# Patient Record
Sex: Male | Born: 1965
Health system: Southern US, Community
[De-identification: ages and names within clinical notes are randomized; demographics above are authoritative.]

## PROBLEM LIST (undated history)

## (undated) DIAGNOSIS — E7800 Pure hypercholesterolemia, unspecified: Secondary | ICD-10-CM

## (undated) DIAGNOSIS — E079 Disorder of thyroid, unspecified: Secondary | ICD-10-CM

## (undated) HISTORY — PX: SHOULDER SURGERY: SHX246

---

## 2006-02-02 ENCOUNTER — Ambulatory Visit (HOSPITAL_COMMUNITY): Admission: RE | Admit: 2006-02-02 | Discharge: 2006-02-02 | Payer: Self-pay | Admitting: *Deleted

## 2008-01-27 ENCOUNTER — Encounter: Admission: RE | Admit: 2008-01-27 | Discharge: 2008-01-27 | Payer: Self-pay | Admitting: Family Medicine

## 2008-02-03 ENCOUNTER — Encounter: Admission: RE | Admit: 2008-02-03 | Discharge: 2008-02-03 | Payer: Self-pay | Admitting: Family Medicine

## 2010-06-05 ENCOUNTER — Encounter: Payer: Self-pay | Admitting: *Deleted

## 2011-04-16 ENCOUNTER — Emergency Department (HOSPITAL_COMMUNITY): Payer: BC Managed Care – PPO

## 2011-04-16 ENCOUNTER — Encounter: Payer: Self-pay | Admitting: *Deleted

## 2011-04-16 ENCOUNTER — Emergency Department (HOSPITAL_COMMUNITY)
Admission: EM | Admit: 2011-04-16 | Discharge: 2011-04-16 | Disposition: A | Discharge status: Home / Self Care | Payer: BC Managed Care – PPO | Attending: Emergency Medicine | Admitting: Emergency Medicine

## 2011-04-16 DIAGNOSIS — S92009A Unspecified fracture of unspecified calcaneus, initial encounter for closed fracture: Secondary | ICD-10-CM

## 2011-04-16 DIAGNOSIS — E7800 Pure hypercholesterolemia, unspecified: Secondary | ICD-10-CM | POA: Insufficient documentation

## 2011-04-16 DIAGNOSIS — Z79899 Other long term (current) drug therapy: Secondary | ICD-10-CM | POA: Insufficient documentation

## 2011-04-16 DIAGNOSIS — M79609 Pain in unspecified limb: Secondary | ICD-10-CM | POA: Insufficient documentation

## 2011-04-16 DIAGNOSIS — W11XXXA Fall on and from ladder, initial encounter: Secondary | ICD-10-CM | POA: Insufficient documentation

## 2011-04-16 DIAGNOSIS — Z7982 Long term (current) use of aspirin: Secondary | ICD-10-CM | POA: Insufficient documentation

## 2011-04-16 DIAGNOSIS — M25579 Pain in unspecified ankle and joints of unspecified foot: Secondary | ICD-10-CM | POA: Insufficient documentation

## 2011-04-16 HISTORY — DX: Disorder of thyroid, unspecified: E07.9

## 2011-04-16 HISTORY — DX: Pure hypercholesterolemia, unspecified: E78.00

## 2011-04-16 MED ORDER — HYDROCODONE-ACETAMINOPHEN 5-500 MG PO TABS: 1.0000 | ORAL_TABLET | Freq: Four times a day (QID) | ORAL | Status: AC | PRN

## 2011-04-16 MED ORDER — IBUPROFEN 800 MG PO TABS
800.0000 mg | ORAL_TABLET | Freq: Once | ORAL | Status: AC
  Administered 2011-04-16: 800 mg via ORAL
  Filled 2011-04-16: qty 1

## 2011-04-16 NOTE — Progress Notes (Signed)
Orthopedic Tech Progress Note Patient Details:  Robert Montes 01-30-1966 [id number]  Type of Splint: Short Leg;Stirrup Splint Location: applied to left leg    Gaye Pollack 04/16/2011, 3:17 PM

## 2011-04-16 NOTE — ED Notes (Signed)
Waiting for CT scan at this time.

## 2011-04-16 NOTE — ED Provider Notes (Signed)
History     CSN: [id number] Arrival date & time: 04/16/2011  9:16 AM   First MD Initiated Contact with Patient 04/16/11 [phone]      Chief Complaint  Patient presents with  . Foot Pain    (Consider location/radiation/quality/duration/timing/severity/associated sxs/prior treatment) HPI  Patient presents to emergency department complaining of acute onset bilateral heel and foot pain stating yesterday when he was on top of a ladder, approximately 15 feet and began to lose his balance and therefore jumped off to the ground landing directly on bilateral heels. Patient states that he had immediate pain however throughout the day yesterday and today ongoing persistent pain greatest in left he'll with inability to bear weight on left he'll do to pain. Patient states mild pain and tenderness into right foot and he'll but able to bear weight without difficulty. Patient has been taking at home, ibuprofen for pain but states ongoing inability to bear weight due to severe pain in left he'll. Patient denies any lower back pain, hip pain, or knee pain. Patient states he has hypothyroid and high cholesterol but no other known medical problems. Patient states when he ambulates he has no pain in back, hips, or knees however does have pain and left he'll. Patient has a primary care physician, but no orthopedic specialist. Past Medical History  Diagnosis Date  . High cholesterol   . Thyroid disease     History reviewed. No pertinent past surgical history.  History reviewed. No pertinent family history.  History  Substance Use Topics  . Smoking status: Never Smoker   . Smokeless tobacco: Not on file  . Alcohol Use:       Review of Systems  All other systems reviewed and are negative.    Allergies  Review of patient's allergies indicates no known allergies.  Home Medications   Current Outpatient Rx  Name Route Sig Dispense Refill  . ASPIRIN EC 81 MG PO TBEC Oral Take 81 mg by mouth daily.        . ATORVASTATIN CALCIUM 10 MG PO TABS Oral Take 10 mg by mouth daily.      . IBUPROFEN 200 MG PO TABS Oral Take 400 mg by mouth every 6 (six) hours as needed. For pain.     Marland Kitchen LEVOTHYROXINE SODIUM 112 MCG PO TABS Oral Take 112 mcg by mouth daily.      . NYQUIL PO Oral Take 2 capsules by mouth every 8 (eight) hours as needed. For cold.       BP 140/82  Pulse 76  Temp(Src) 97.9 F (36.6 C) (Oral)  SpO2 97%  Physical Exam  Nursing note and vitals reviewed. Constitutional: He is oriented to person, place, and time. He appears well-developed and well-nourished. No distress.  HENT:  Head: Normocephalic and atraumatic.  Eyes: Conjunctivae are normal.  Neck: Normal range of motion. Neck supple.  Cardiovascular: Normal rate.   Pulmonary/Chest: Effort normal.  Abdominal: Soft. Bowel sounds are normal. He exhibits no distension and no mass. There is no tenderness. There is no rebound and no guarding.  Musculoskeletal: Normal range of motion. He exhibits tenderness. He exhibits no edema.       FROM of neck without pain and no spinal TTP or soft tissue TTP of back.  FROM of bilateral hips and knees without pain and no TTP.  No TTP of bilateral ankles. TTP of bilateral heels but greater TTP of left heel and forefoot. No break in skin, deformity or swelling. Good pedal pulse bilaterally.  Neurological: He is alert and oriented to person, place, and time. He has normal reflexes.  Skin: Skin is warm and dry. No rash noted. He is not diaphoretic. No erythema.  Psychiatric: He has a normal mood and affect.    ED Course  Procedures (including critical care time)  PO ibuprofen  Reviewed CT scan with Dr. Shon Baton with him stating that the injury should be nonweightbearing and that a posterior splint with stirrups should be applied. Patient is appropriate for outpatient followup with Dr. Victorino Dike per Dr. Shon Baton.  Labs Reviewed - No data to display No results found.   1. Fracture of calcaneus        MDM  No complaints of lower back, hip or knee pain after fall. Only complaining of heel pain with calcaneous fx on CT scan. At length about treatment plan and followup with orthopedic specialist. He voices his understanding.   Medical screening examination/treatment/procedure(s) were performed by non-physician practitioner and as supervising physician I was immediately available for consultation/collaboration. Osvaldo Human, M.D.      Jenness Corner, PA 04/16/11 [address] [place], [place] 04/16/11 1527  Carleene Cooper III, MD 04/17/11 [phone]

## 2011-04-16 NOTE — ED Notes (Signed)
Patient jumped off of a ladder yesterday landing on his heels.  Pt states that he has had increase in pain to left heel and arch of his left foot.  Pt states that he is unable to bear weight without pain.  No trauma noted.  Pt states that he has been icing and elevating bilateral feet.  Pt took Advil yesterday for pain, none today.

## 2011-04-16 NOTE — ED Notes (Signed)
Patient transported to CT 

## 2011-04-16 NOTE — ED Notes (Signed)
Pt waiting for radiology at this time.

## 2013-01-28 IMAGING — CR DG FOOT COMPLETE 3+V*L*
3 series · 3 of 3 positions shown · non-contrast
Comparison: Os calcis today.

CLINICAL DATA: Fall, foot pain.

LEFT FOOT - COMPLETE 3+ VIEW

[x foot ap left]
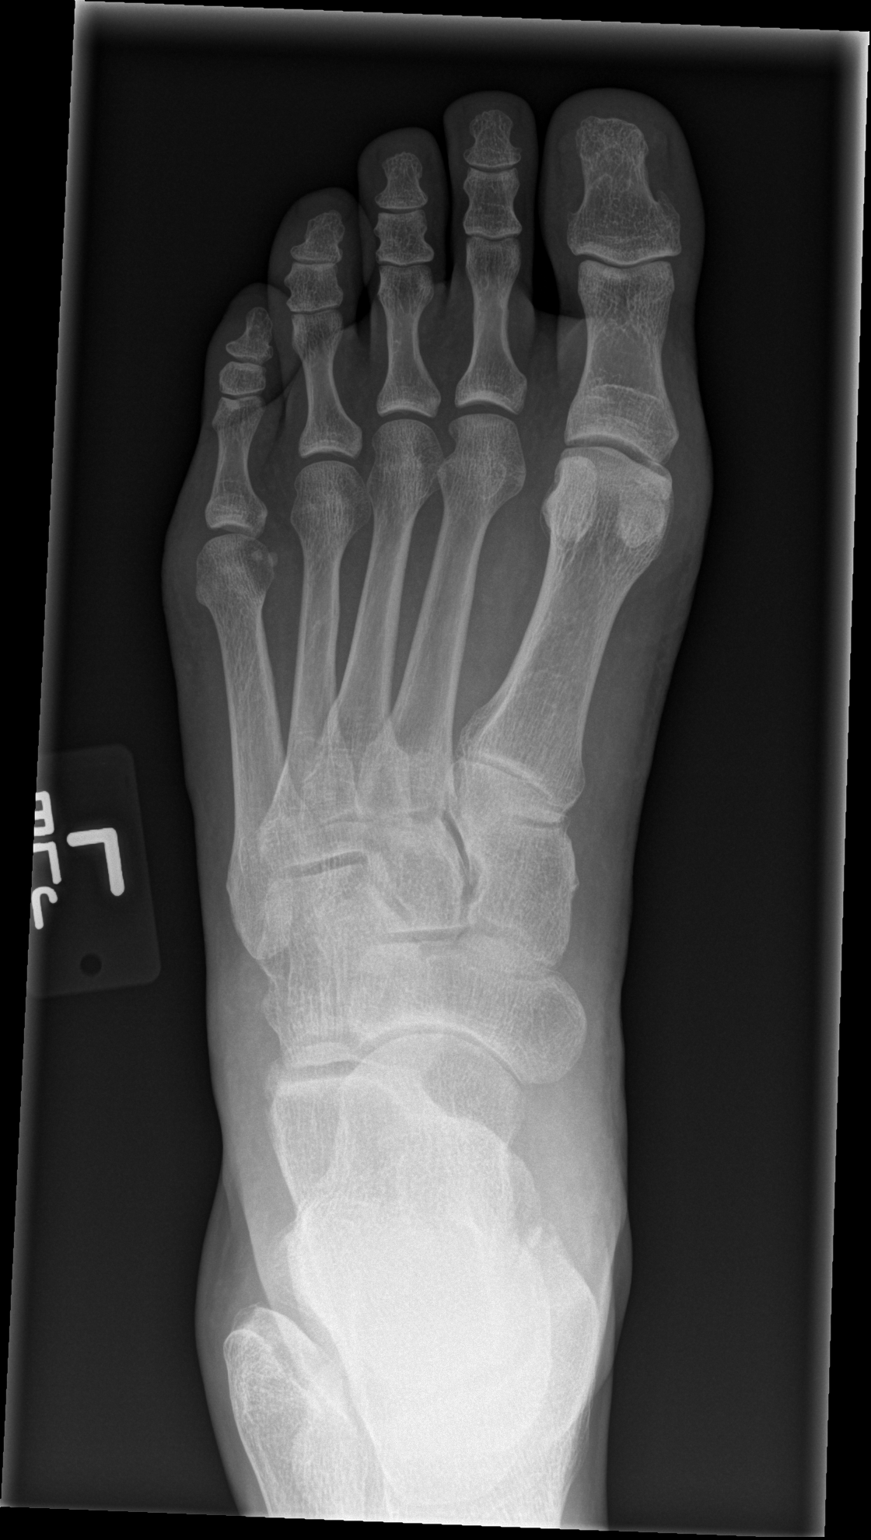

[x foot obl left]
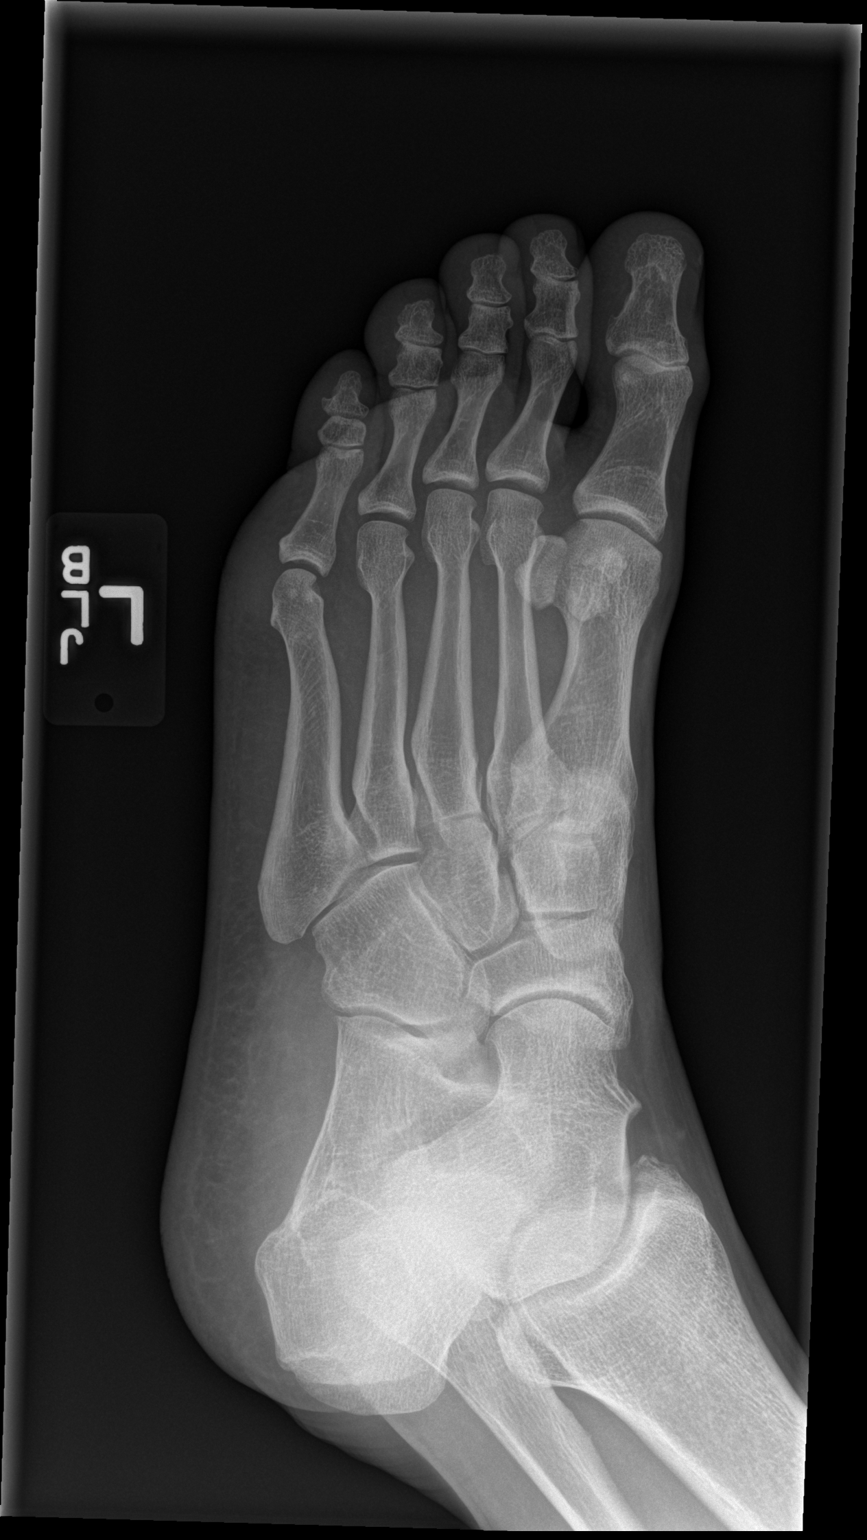

[x foot lat left]
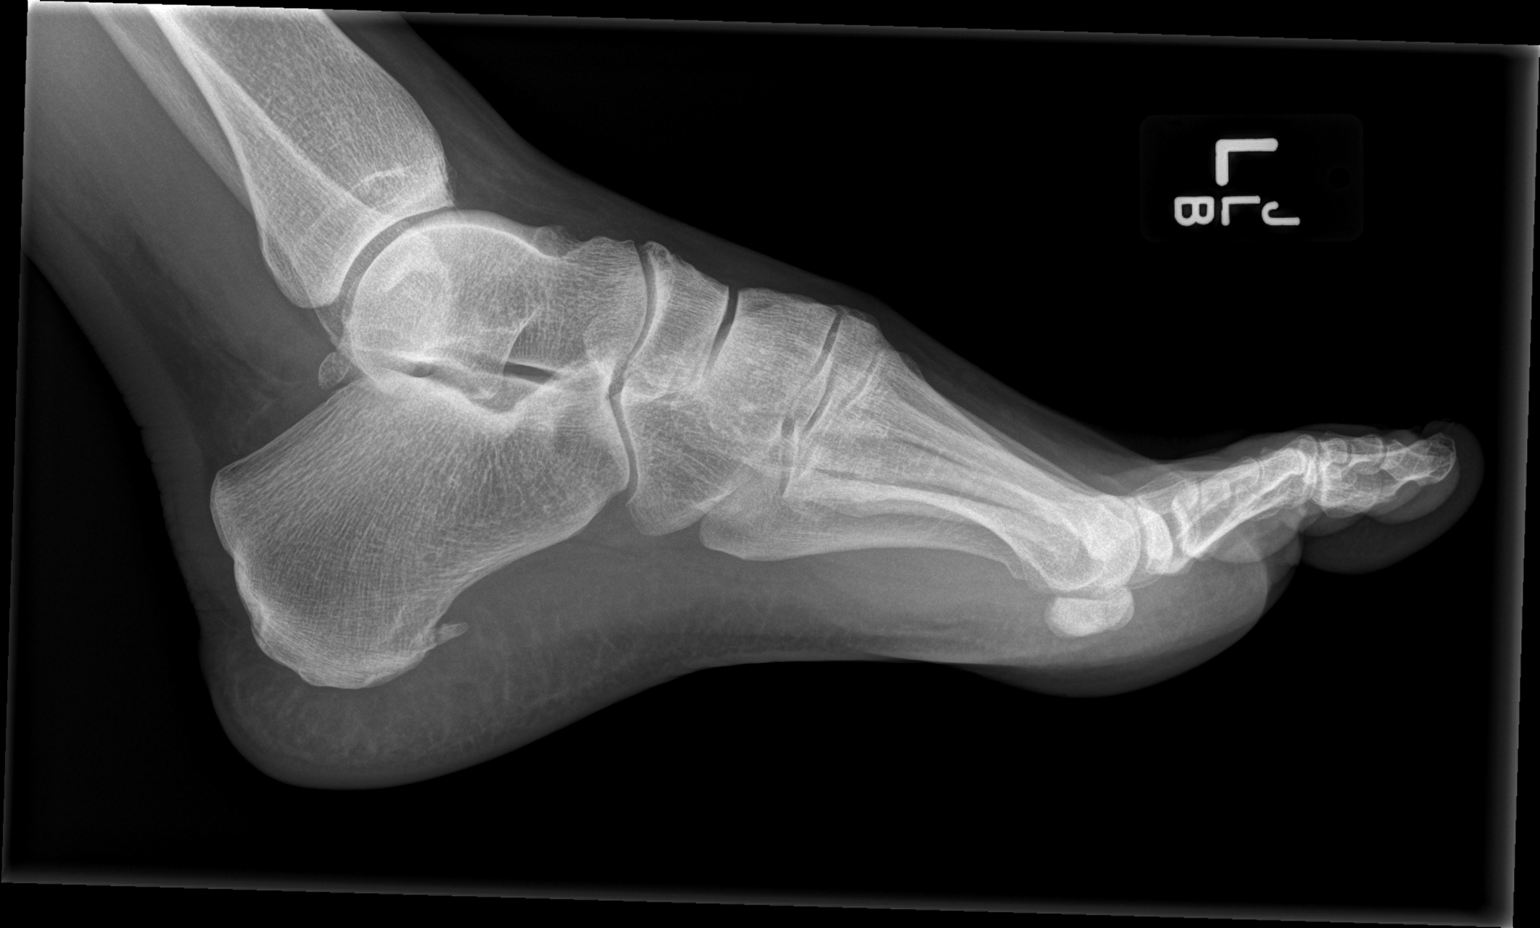

[3 of 3 positions shown; findings below may reference images not displayed]

FINDINGS: Plantar calcaneal spurs noted.  Question fracture through
the base of the spur.  No additional acute bony abnormality.  Joint
spaces are maintained.  Soft tissues are unremarkable.
IMPRESSION: Calcaneal spur.  Question fracture through the base of the spur.
No additional acute bony abnormality.

## 2013-01-28 IMAGING — CT CT FOOT*L* W/O CM
2 of 4 series · 7 of 35 positions shown, 8 images · non-contrast
Comparison: Radiographs same date.

CLINICAL DATA: Fell.  Injured foot.

CT OF THE LEFT FOOT WITHOUT CONTRAST
TECHNIQUE: Multidetector CT imaging was performed according to the
standard protocol. Multiplanar CT image reconstructions were also
generated.

[coronal left heel · axial · 0.25mm/px · z∈[-207,-150]mm · 4 of 71 slices shown, 5 images]
[im 12/71  soft-tissue]
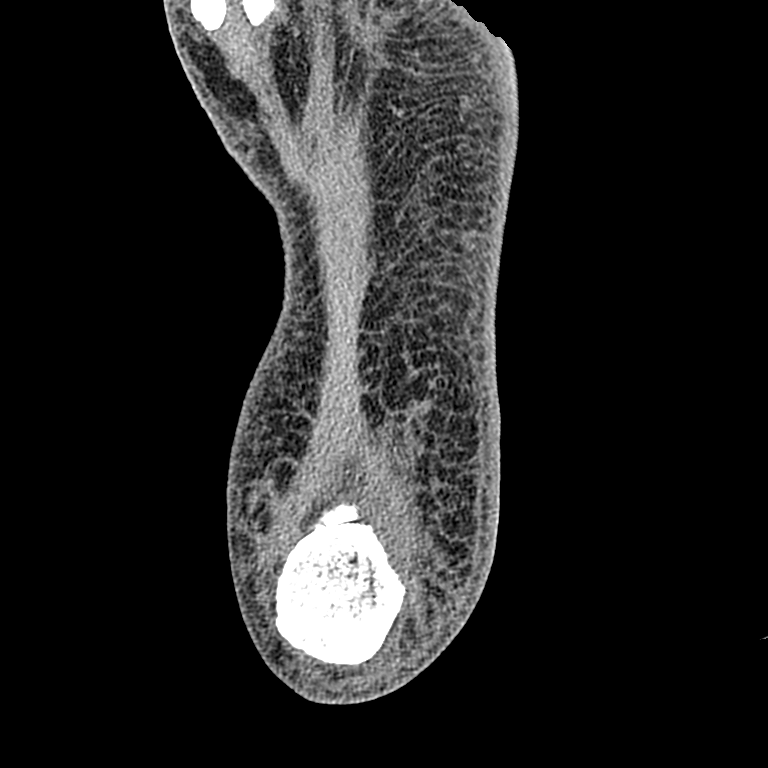
[im 12/71  bone]
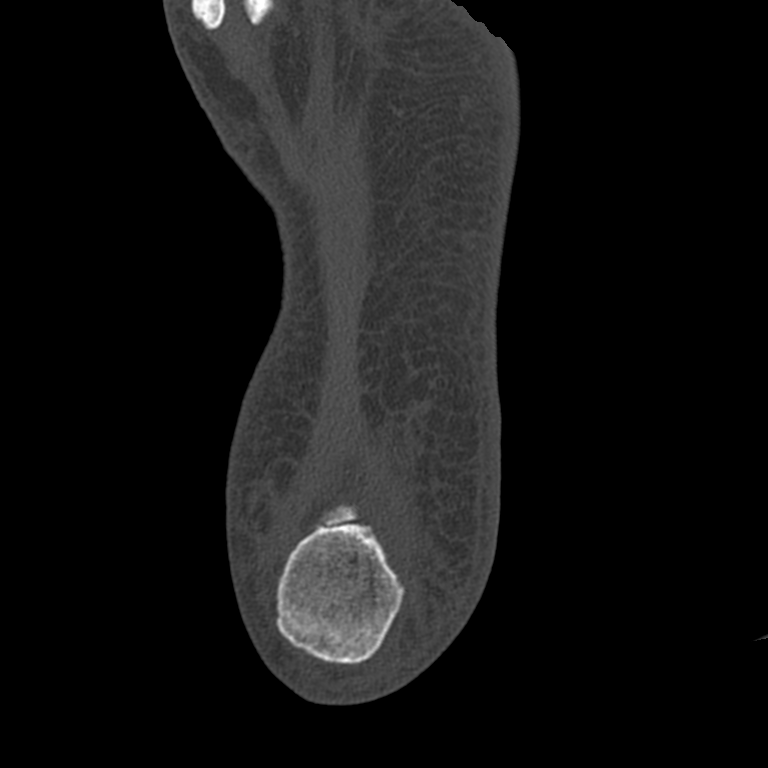
[im 24/71  bone]
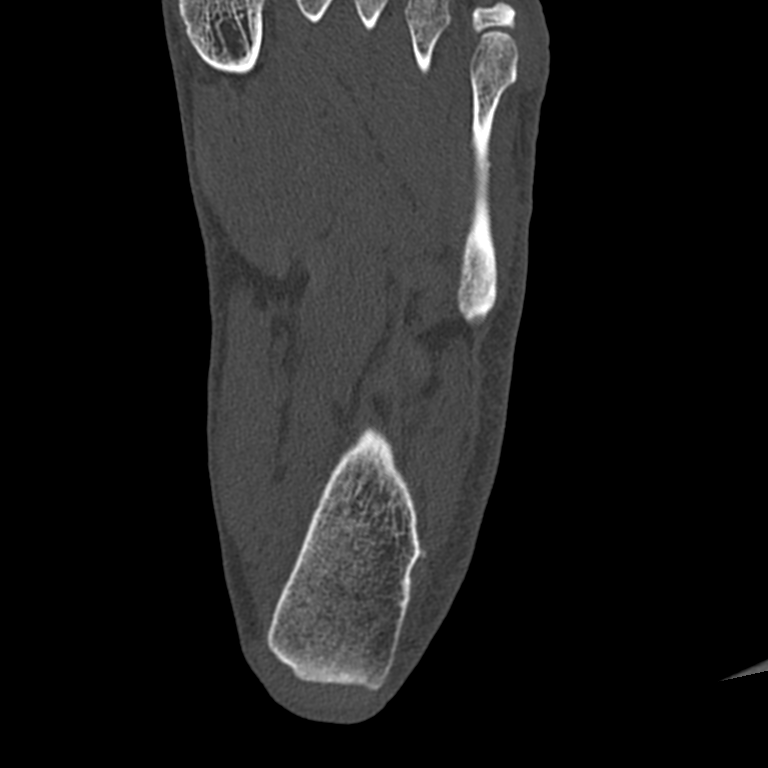
[im 47/71  bone]
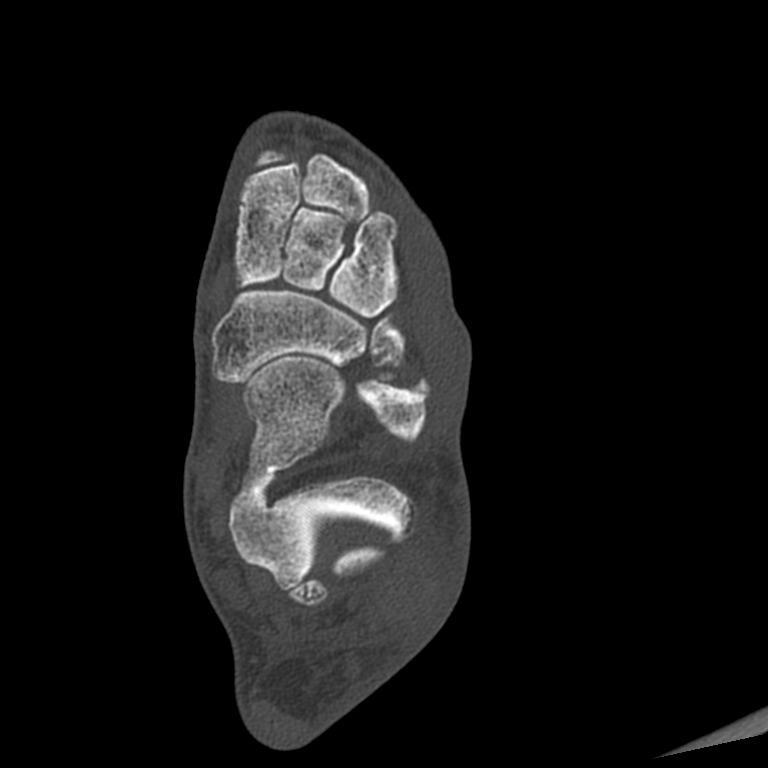
[im 59/71  bone]
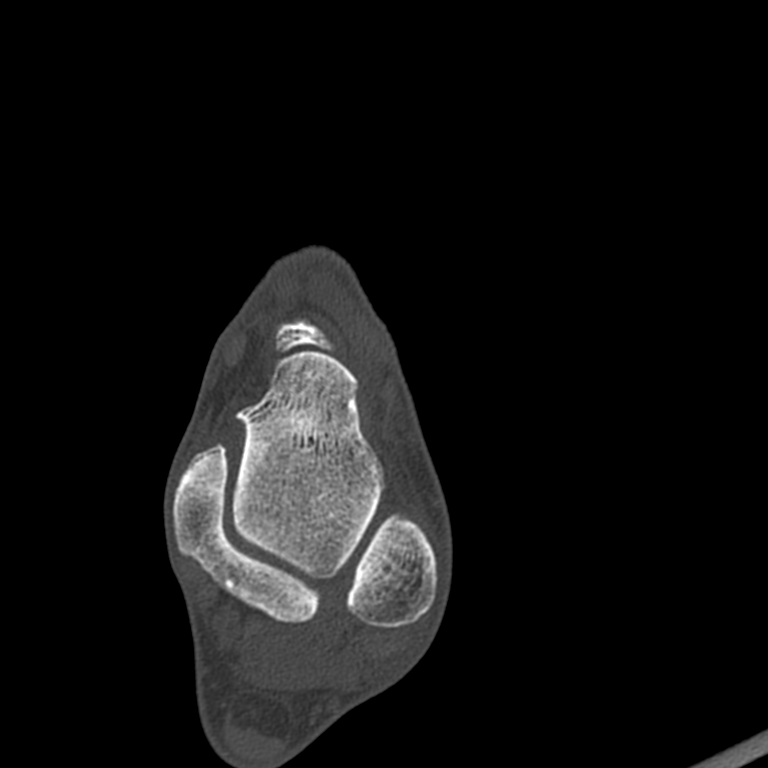

[axial left heel · coronal · 0.25mm/px · 3 of 96 slices shown]
[im 26/96  bone]
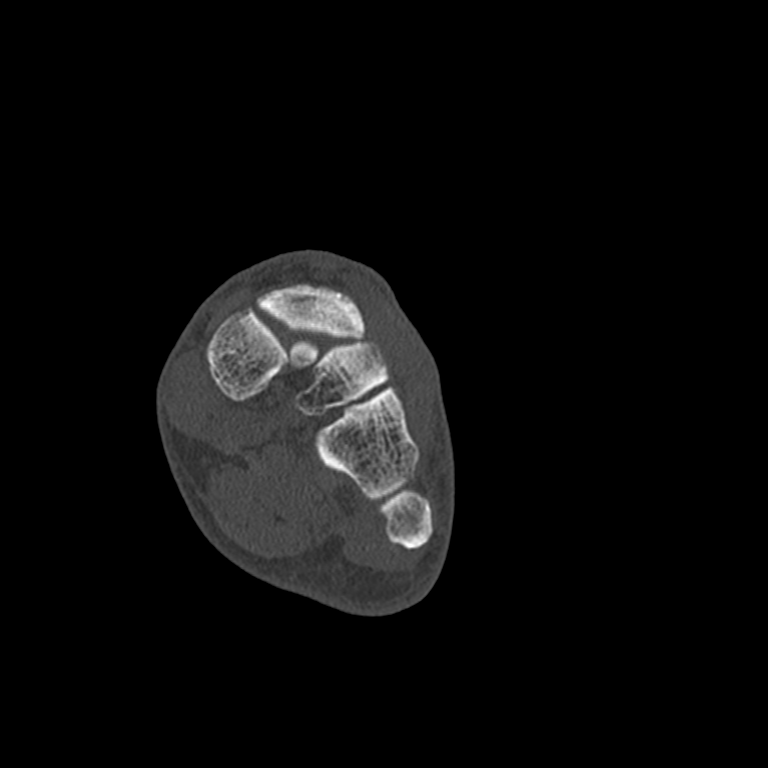
[im 41/96  bone]
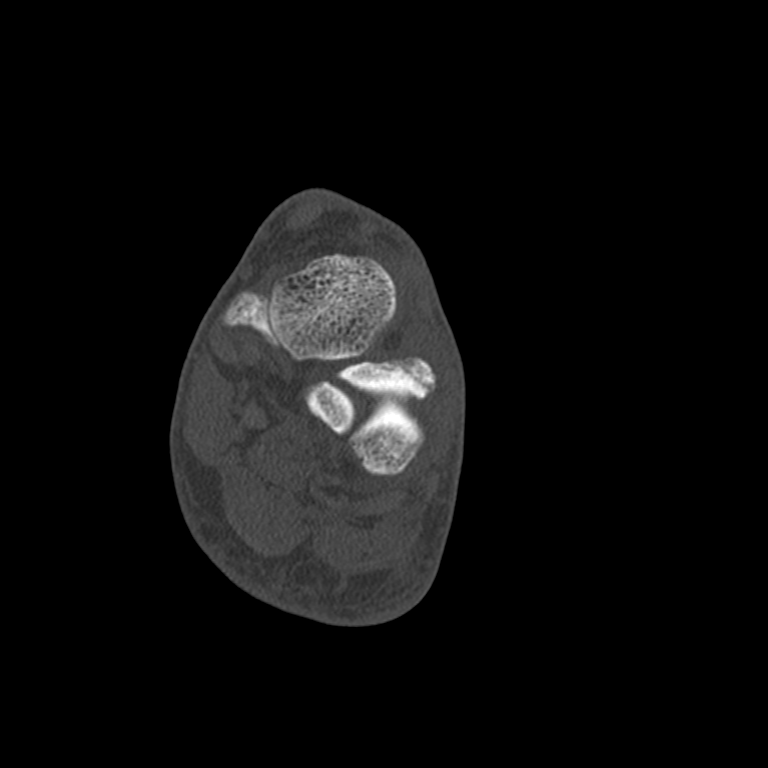
[im 56/96  bone]
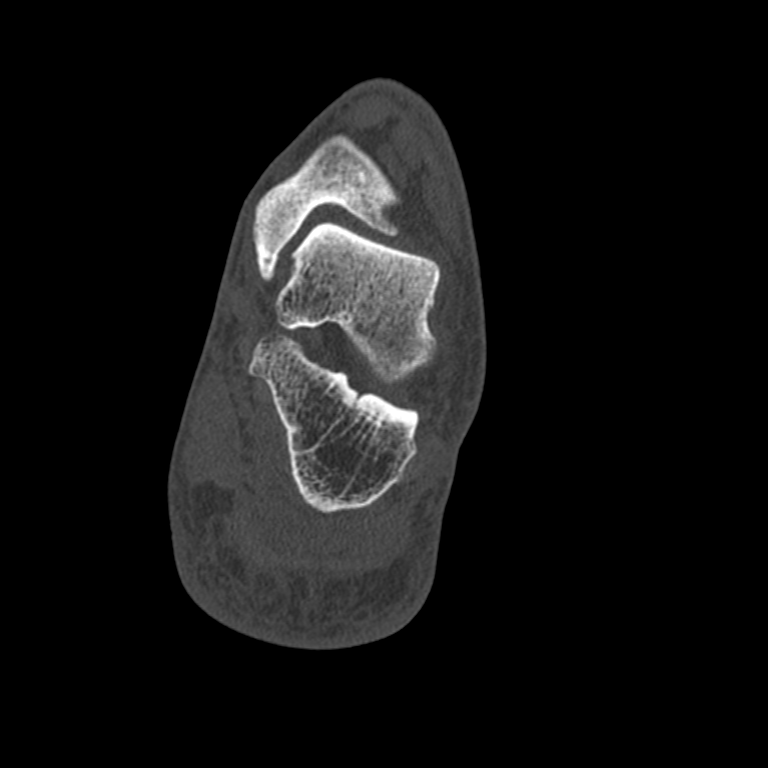

[7 of 35 positions shown; findings below may reference images not displayed]

FINDINGS: The tibiotalar and subtalar joints are maintained.  No
acute fracture or osteochondral abnormality involving the ankle.

There is a detached calcaneal heel spur but this is not an acute
fracture.  Os trigonum is noted.

There is a an acute fracture involving the anterior process of the
calcaneus with associated adjacent soft tissue swelling/edema.  The
remaining foot musculature appears normal.  Minimal midfoot
degenerative changes.
IMPRESSION: Acute fracture of the anterior process of the calcaneus.

## 2014-07-26 ENCOUNTER — Emergency Department (HOSPITAL_COMMUNITY)
Admission: EM | Admit: 2014-07-26 | Discharge: 2014-07-26 | Disposition: A | Discharge status: Home / Self Care | Payer: BLUE CROSS/BLUE SHIELD | Attending: Emergency Medicine | Admitting: Emergency Medicine

## 2014-07-26 ENCOUNTER — Emergency Department (HOSPITAL_COMMUNITY): Payer: BLUE CROSS/BLUE SHIELD

## 2014-07-26 ENCOUNTER — Encounter (HOSPITAL_COMMUNITY): Payer: Self-pay | Admitting: *Deleted

## 2014-07-26 DIAGNOSIS — Y92322 Soccer field as the place of occurrence of the external cause: Secondary | ICD-10-CM | POA: Diagnosis not present

## 2014-07-26 DIAGNOSIS — Y9366 Activity, soccer: Secondary | ICD-10-CM | POA: Insufficient documentation

## 2014-07-26 DIAGNOSIS — E079 Disorder of thyroid, unspecified: Secondary | ICD-10-CM | POA: Insufficient documentation

## 2014-07-26 DIAGNOSIS — Y998 Other external cause status: Secondary | ICD-10-CM | POA: Insufficient documentation

## 2014-07-26 DIAGNOSIS — E780 Pure hypercholesterolemia: Secondary | ICD-10-CM | POA: Insufficient documentation

## 2014-07-26 DIAGNOSIS — M25511 Pain in right shoulder: Secondary | ICD-10-CM

## 2014-07-26 DIAGNOSIS — S4991XA Unspecified injury of right shoulder and upper arm, initial encounter: Secondary | ICD-10-CM | POA: Insufficient documentation

## 2014-07-26 DIAGNOSIS — Z79899 Other long term (current) drug therapy: Secondary | ICD-10-CM | POA: Insufficient documentation

## 2014-07-26 DIAGNOSIS — Z7982 Long term (current) use of aspirin: Secondary | ICD-10-CM | POA: Insufficient documentation

## 2014-07-26 DIAGNOSIS — W1839XA Other fall on same level, initial encounter: Secondary | ICD-10-CM | POA: Diagnosis not present

## 2014-07-26 MED ORDER — NAPROXEN 500 MG PO TABS: 500.0000 mg | ORAL_TABLET | Freq: Two times a day (BID) | ORAL | Status: AC

## 2014-07-26 MED ORDER — NAPROXEN 250 MG PO TABS: 500.0000 mg | ORAL_TABLET | Freq: Once | ORAL | Status: DC

## 2014-07-26 MED ORDER — HYDROCODONE-ACETAMINOPHEN 5-325 MG PO TABS: 1.0000 | ORAL_TABLET | ORAL | Status: AC | PRN

## 2014-07-26 NOTE — ED Provider Notes (Signed)
CSN: [id number]     Arrival date & time 07/26/14  2128 History   First MD Initiated Contact with Patient 07/26/14 2236     Chief Complaint  Patient presents with  . Shoulder Injury   (Consider location/radiation/quality/duration/timing/severity/associated sxs/prior Treatment) HPI  Robert Montes is a 49 yo male presenting with right shoulder pain.  He reports landing on his shoulder tonight after playing soccer and since then it has been painful.  He rates the pain as 4/10 currently, but is worse when raising his arm.  He denies any alteration in strength or sensation.    Past Medical History  Diagnosis Date  . High cholesterol   . Thyroid disease    History reviewed. No pertinent past surgical history. No family history on file. History  Substance Use Topics  . Smoking status: Never Smoker   . Smokeless tobacco: Not on file  . Alcohol Use: Yes    Review of Systems  Musculoskeletal: Positive for arthralgias. Negative for joint swelling.  Neurological: Negative for weakness and numbness.    Allergies  Review of patient's allergies indicates no known allergies.  Home Medications   Prior to Admission medications   Medication Sig Start Date End Date Taking? Authorizing Provider  aspirin EC 81 MG tablet Take 81 mg by mouth daily.      Historical Provider, MD  atorvastatin (LIPITOR) 10 MG tablet Take 10 mg by mouth daily.      Historical Provider, MD  ibuprofen (ADVIL,MOTRIN) 200 MG tablet Take 400 mg by mouth every 6 (six) hours as needed. For pain.     Historical Provider, MD  levothyroxine (SYNTHROID, LEVOTHROID) 112 MCG tablet Take 112 mcg by mouth daily.      Historical Provider, MD  Pseudoeph-Doxylamine-DM-APAP (NYQUIL PO) Take 2 capsules by mouth every 8 (eight) hours as needed. For cold.     Historical Provider, MD   BP 150/85 mmHg  Pulse 69  Temp(Src) 98.4 F (36.9 C) (Oral)  Resp 22  SpO2 99% Physical Exam  Constitutional: He appears well-developed and  well-nourished. No distress.  HENT:  Head: Normocephalic and atraumatic.  Eyes: Conjunctivae are normal. Right eye exhibits no discharge. Left eye exhibits no discharge. No scleral icterus.  Cardiovascular: Intact distal pulses.   Pulmonary/Chest: Effort normal.  Musculoskeletal: He exhibits tenderness.  5/5 strength with flexion and extension. ROM limited to 90 degrees with abduction. No deformity noted, N/V intact.   Neurological: He is alert. Coordination normal.  Skin: He is not diaphoretic.  Nursing note and vitals reviewed.   ED Course  Procedures (including critical care time) Labs Review Labs Reviewed - No data to display  Imaging Review Dg Shoulder Right  07/26/2014   CLINICAL DATA:  Right shoulder injury. Fell on right shoulder while playing soccer. Initial encounter.  EXAM: RIGHT SHOULDER - 2+ VIEW  COMPARISON:  None.  FINDINGS: There is no evidence of fracture or dislocation. The right humeral head is seated within the glenoid fossa. The acromioclavicular joint is unremarkable in appearance. No significant soft tissue abnormalities are seen. The visualized portions of the right lung are clear.  IMPRESSION: No evidence of fracture or dislocation.   Electronically Signed   By: Roanna Raider M.D.   On: 07/26/2014 22:26     EKG Interpretation None      MDM   Final diagnoses:  Shoulder pain, acute, right   49 yo with shoulder pain after injury. His x-ray negative for obvious fracture or dislocation. He declines meds  in the ED. Referral provided to follow up with orthopedics if symptoms persist. He declined arm sling. Discussed conservative therapy. Patient will be dc home & is agreeable with above plan.   Filed Vitals:   07/26/14 2140 07/26/14 2301  BP: 150/85 139/82  Pulse: 69 66  Temp: 98.4 F (36.9 C) 97.9 F (36.6 C)  TempSrc: Oral Oral  Resp: 22 14  SpO2: 99% 98%   Meds given in ED:  Medications  naproxen (NAPROSYN) tablet 500 mg (not administered)     New Prescriptions   HYDROCODONE-ACETAMINOPHEN (NORCO/VICODIN) 5-325 MG PER TABLET    Take 1 tablet by mouth every 4 (four) hours as needed.   NAPROXEN (NAPROSYN) 500 MG TABLET    Take 1 tablet (500 mg total) by mouth 2 (two) times daily.       Harle BattiestElizabeth Aalijah Mims, NP 07/28/14 [id number]  Blake DivineJohn Wofford, MD 07/30/14 [phone]

## 2014-07-26 NOTE — Discharge Instructions (Signed)
Please follow the directions provided. Be sure to follow up with the orthopedic doctor for further evaluation of your shoulder injury.  Take the naproxen twice a day to help with pain and inflammation. Take the vicodin for pain not relieved by the naproxen. Don't hesitate to return for any new, worsening or concerning symptoms.     SEEK IMMEDIATE MEDICAL CARE IF:  Your arm, hand, or fingers are numb or tingling.  Your arm, hand, or fingers are significantly swollen or turn white or blue.

## 2014-07-26 NOTE — ED Notes (Signed)
The pt injured his rt shoulder playing soccer earlier tonight.  Worse with movement

## 2015-11-11 DIAGNOSIS — E559 Vitamin D deficiency, unspecified: Secondary | ICD-10-CM | POA: Diagnosis not present

## 2015-11-11 DIAGNOSIS — R7989 Other specified abnormal findings of blood chemistry: Secondary | ICD-10-CM | POA: Diagnosis not present

## 2015-11-11 DIAGNOSIS — E039 Hypothyroidism, unspecified: Secondary | ICD-10-CM | POA: Diagnosis not present

## 2015-11-11 DIAGNOSIS — Z6830 Body mass index (BMI) 30.0-30.9, adult: Secondary | ICD-10-CM | POA: Diagnosis not present

## 2015-11-11 DIAGNOSIS — E782 Mixed hyperlipidemia: Secondary | ICD-10-CM | POA: Diagnosis not present

## 2015-12-13 DIAGNOSIS — E559 Vitamin D deficiency, unspecified: Secondary | ICD-10-CM | POA: Diagnosis not present

## 2015-12-13 DIAGNOSIS — R7989 Other specified abnormal findings of blood chemistry: Secondary | ICD-10-CM | POA: Diagnosis not present

## 2015-12-20 DIAGNOSIS — E039 Hypothyroidism, unspecified: Secondary | ICD-10-CM | POA: Diagnosis not present

## 2015-12-20 DIAGNOSIS — E782 Mixed hyperlipidemia: Secondary | ICD-10-CM | POA: Diagnosis not present

## 2015-12-20 DIAGNOSIS — E559 Vitamin D deficiency, unspecified: Secondary | ICD-10-CM | POA: Diagnosis not present

## 2015-12-20 DIAGNOSIS — L309 Dermatitis, unspecified: Secondary | ICD-10-CM | POA: Diagnosis not present

## 2015-12-27 DIAGNOSIS — H40013 Open angle with borderline findings, low risk, bilateral: Secondary | ICD-10-CM | POA: Diagnosis not present

## 2016-07-24 DIAGNOSIS — Z6830 Body mass index (BMI) 30.0-30.9, adult: Secondary | ICD-10-CM | POA: Diagnosis not present

## 2016-07-24 DIAGNOSIS — J111 Influenza due to unidentified influenza virus with other respiratory manifestations: Secondary | ICD-10-CM | POA: Diagnosis not present

## 2016-08-25 DIAGNOSIS — Z0000 Encounter for general adult medical examination without abnormal findings: Secondary | ICD-10-CM | POA: Diagnosis not present

## 2016-08-25 DIAGNOSIS — E782 Mixed hyperlipidemia: Secondary | ICD-10-CM | POA: Diagnosis not present

## 2016-08-25 DIAGNOSIS — Z125 Encounter for screening for malignant neoplasm of prostate: Secondary | ICD-10-CM | POA: Diagnosis not present

## 2016-08-25 DIAGNOSIS — E559 Vitamin D deficiency, unspecified: Secondary | ICD-10-CM | POA: Diagnosis not present

## 2016-08-28 DIAGNOSIS — E559 Vitamin D deficiency, unspecified: Secondary | ICD-10-CM | POA: Diagnosis not present

## 2016-08-28 DIAGNOSIS — Z6830 Body mass index (BMI) 30.0-30.9, adult: Secondary | ICD-10-CM | POA: Diagnosis not present

## 2016-08-28 DIAGNOSIS — Z1211 Encounter for screening for malignant neoplasm of colon: Secondary | ICD-10-CM | POA: Diagnosis not present

## 2016-08-28 DIAGNOSIS — Z0000 Encounter for general adult medical examination without abnormal findings: Secondary | ICD-10-CM | POA: Diagnosis not present

## 2016-08-28 DIAGNOSIS — E039 Hypothyroidism, unspecified: Secondary | ICD-10-CM | POA: Diagnosis not present

## 2016-08-28 DIAGNOSIS — E782 Mixed hyperlipidemia: Secondary | ICD-10-CM | POA: Diagnosis not present

## 2017-02-27 DIAGNOSIS — Z23 Encounter for immunization: Secondary | ICD-10-CM | POA: Diagnosis not present

## 2017-09-11 DIAGNOSIS — Z1329 Encounter for screening for other suspected endocrine disorder: Secondary | ICD-10-CM | POA: Diagnosis not present

## 2017-09-11 DIAGNOSIS — Z0000 Encounter for general adult medical examination without abnormal findings: Secondary | ICD-10-CM | POA: Diagnosis not present

## 2017-09-11 DIAGNOSIS — Z114 Encounter for screening for human immunodeficiency virus [HIV]: Secondary | ICD-10-CM | POA: Diagnosis not present

## 2017-09-11 DIAGNOSIS — Z125 Encounter for screening for malignant neoplasm of prostate: Secondary | ICD-10-CM | POA: Diagnosis not present

## 2017-09-11 DIAGNOSIS — Z1159 Encounter for screening for other viral diseases: Secondary | ICD-10-CM | POA: Diagnosis not present

## 2017-09-11 DIAGNOSIS — Z13220 Encounter for screening for lipoid disorders: Secondary | ICD-10-CM | POA: Diagnosis not present

## 2017-09-13 DIAGNOSIS — Z1211 Encounter for screening for malignant neoplasm of colon: Secondary | ICD-10-CM | POA: Diagnosis not present

## 2017-09-13 DIAGNOSIS — Z6829 Body mass index (BMI) 29.0-29.9, adult: Secondary | ICD-10-CM | POA: Diagnosis not present

## 2017-09-13 DIAGNOSIS — H6122 Impacted cerumen, left ear: Secondary | ICD-10-CM | POA: Diagnosis not present

## 2017-09-13 DIAGNOSIS — Z23 Encounter for immunization: Secondary | ICD-10-CM | POA: Diagnosis not present

## 2017-09-13 DIAGNOSIS — R7989 Other specified abnormal findings of blood chemistry: Secondary | ICD-10-CM | POA: Diagnosis not present

## 2017-09-25 DIAGNOSIS — M7541 Impingement syndrome of right shoulder: Secondary | ICD-10-CM | POA: Diagnosis not present

## 2017-09-25 DIAGNOSIS — M25511 Pain in right shoulder: Secondary | ICD-10-CM | POA: Diagnosis not present

## 2017-09-25 DIAGNOSIS — M19011 Primary osteoarthritis, right shoulder: Secondary | ICD-10-CM | POA: Diagnosis not present

## 2017-09-27 DIAGNOSIS — S46311A Strain of muscle, fascia and tendon of triceps, right arm, initial encounter: Secondary | ICD-10-CM | POA: Diagnosis not present

## 2017-10-03 DIAGNOSIS — M25521 Pain in right elbow: Secondary | ICD-10-CM | POA: Diagnosis not present

## 2017-10-09 DIAGNOSIS — M25521 Pain in right elbow: Secondary | ICD-10-CM | POA: Diagnosis not present

## 2017-10-09 DIAGNOSIS — M7711 Lateral epicondylitis, right elbow: Secondary | ICD-10-CM | POA: Diagnosis not present

## 2017-10-09 DIAGNOSIS — S46311D Strain of muscle, fascia and tendon of triceps, right arm, subsequent encounter: Secondary | ICD-10-CM | POA: Diagnosis not present

## 2017-10-22 DIAGNOSIS — M25511 Pain in right shoulder: Secondary | ICD-10-CM | POA: Diagnosis not present

## 2017-10-26 DIAGNOSIS — Z6829 Body mass index (BMI) 29.0-29.9, adult: Secondary | ICD-10-CM | POA: Diagnosis not present

## 2017-10-26 DIAGNOSIS — H00036 Abscess of eyelid left eye, unspecified eyelid: Secondary | ICD-10-CM | POA: Diagnosis not present

## 2017-11-01 DIAGNOSIS — M25511 Pain in right shoulder: Secondary | ICD-10-CM | POA: Diagnosis not present

## 2017-11-02 DIAGNOSIS — Z6829 Body mass index (BMI) 29.0-29.9, adult: Secondary | ICD-10-CM | POA: Diagnosis not present

## 2017-11-02 DIAGNOSIS — L209 Atopic dermatitis, unspecified: Secondary | ICD-10-CM | POA: Diagnosis not present

## 2017-11-04 ENCOUNTER — Encounter (HOSPITAL_COMMUNITY): Payer: Self-pay

## 2017-11-04 ENCOUNTER — Emergency Department (HOSPITAL_COMMUNITY)
Admission: EM | Admit: 2017-11-04 | Discharge: 2017-11-04 | Disposition: A | Discharge status: Home / Self Care | Payer: BLUE CROSS/BLUE SHIELD | Attending: Emergency Medicine | Admitting: Emergency Medicine

## 2017-11-04 DIAGNOSIS — Z79899 Other long term (current) drug therapy: Secondary | ICD-10-CM | POA: Diagnosis not present

## 2017-11-04 DIAGNOSIS — E039 Hypothyroidism, unspecified: Secondary | ICD-10-CM | POA: Diagnosis not present

## 2017-11-04 DIAGNOSIS — H5712 Ocular pain, left eye: Secondary | ICD-10-CM | POA: Diagnosis present

## 2017-11-04 DIAGNOSIS — R220 Localized swelling, mass and lump, head: Secondary | ICD-10-CM | POA: Diagnosis not present

## 2017-11-04 DIAGNOSIS — Z7982 Long term (current) use of aspirin: Secondary | ICD-10-CM | POA: Diagnosis not present

## 2017-11-04 DIAGNOSIS — H00024 Hordeolum internum left upper eyelid: Secondary | ICD-10-CM | POA: Insufficient documentation

## 2017-11-04 MED ORDER — TETRACAINE HCL 0.5 % OP SOLN
2.0000 [drp] | Freq: Once | OPHTHALMIC | Status: DC
  Filled 2017-11-04: qty 4

## 2017-11-04 MED ORDER — BACITRACIN-POLYMYXIN B 500-10000 UNIT/GM OP OINT: TOPICAL_OINTMENT | OPHTHALMIC | 0 refills | Status: AC

## 2017-11-04 MED ORDER — FLUORESCEIN SODIUM 1 MG OP STRP
1.0000 | ORAL_STRIP | Freq: Once | OPHTHALMIC | Status: DC
  Filled 2017-11-04: qty 1

## 2017-11-04 NOTE — ED Notes (Signed)
Pt reports he has had this left eyelid swelling X 11 days. Has been on antibiotics, cream and eyedrops.

## 2017-11-04 NOTE — Discharge Instructions (Signed)
Continue to apply warm compresses 4 times a day, combined with massage of the area. Applied the special ophthalmic bacitracin ointment to the left upper eyelid 4 times a day. Follow-up with the ophthalmologist.  Go to the office at 8:30 AM Tuesday, June 25.

## 2017-11-04 NOTE — ED Triage Notes (Signed)
Pt presents for evaluation of swelling and redness to L upper eyelid since 6/12. Has seen PCP 10 days ago for oral abx. Has used steroids and topical eye drops as well with no improvement. States worsening over past 48 hours. Some drainage and tearing, painful to touch. No disturbed vision.

## 2017-11-04 NOTE — ED Provider Notes (Addendum)
[facility] Mont[facility] EMERGENCY DEPARTMENT Provider Note   CSN: [id number] Arrival date & time: 11/04/17  4098     History   Chief Complaint Chief Complaint  Patient presents with  . Eye Pain    HPI Robert Montes is a 52 y.o. male.  HPI   Robert Montes is a 52 y.o. male, with a history of hypercholesterolemia and hypothyroidism, presenting to the ED with painful swelling to the left upper eyelid.  Patient first noticed the lesion on June 12.  Was seen by his PCP on June 14.  Was prescribed 7 days of Keflex.  States the swelling seemed to improve, but symptoms did not resolve.  June 21 he was prescribed Pataday 0.2% and hydrocortisone cream, which seemed to make symptoms worse so he discontinued these yesterday.  He does have some minor drainage from the region in the mornings.  He has been intermittently applying warm compresses.  No contact lens use.  Denies vision changes, vision loss, pain with eye movements, fever, or any other complaints.     Past Medical History:  Diagnosis Date  . High cholesterol   . Thyroid disease     There are no active problems to display for this patient.   History reviewed. No pertinent surgical history.      Home Medications    Prior to Admission medications   Medication Sig Start Date End Date Taking? Authorizing Provider  aspirin EC 81 MG tablet Take 81 mg by mouth daily.      [provider]  atorvastatin (LIPITOR) 10 MG tablet Take 10 mg by mouth daily.      [provider]  bacitracin-polymyxin b (POLYSPORIN) ophthalmic ointment Apply to the left eyelid four times daily while awake. 11/04/17   Latavia Goga C, PA-C  HYDROcodone-acetaminophen (NORCO/VICODIN) 5-325 MG per tablet Take 1 tablet by mouth every 4 (four) hours as needed. 07/26/14   Harle Battiest, NP  ibuprofen (ADVIL,MOTRIN) 200 MG tablet Take 400 mg by mouth every 6 (six) hours as needed. For pain.     [provider]  levothyroxine  (SYNTHROID, LEVOTHROID) 112 MCG tablet Take 112 mcg by mouth daily.      [provider]  naproxen (NAPROSYN) 500 MG tablet Take 1 tablet (500 mg total) by mouth 2 (two) times daily. 07/26/14   Harle Battiest, NP  Pseudoeph-Doxylamine-DM-APAP (NYQUIL PO) Take 2 capsules by mouth every 8 (eight) hours as needed. For cold.     [provider]    Family History No family history on file.  Social History Social History   Tobacco Use  . Smoking status: Never Smoker  Substance Use Topics  . Alcohol use: Yes  . Drug use: Not on file     Allergies   Patient has no known allergies.   Review of Systems Review of Systems  Constitutional: Negative for fever.  HENT: Positive for facial swelling.   Eyes: Positive for pain and discharge. Negative for photophobia, redness and visual disturbance.     Physical Exam Updated Vital Signs BP (!) 161/87 (BP Location: Right Arm)   Pulse 75   Temp 98 F (36.7 C) (Oral)   Resp 16   SpO2 97%   Physical Exam  Constitutional: He appears well-developed and well-nourished. No distress.  HENT:  Head: Normocephalic and atraumatic.  Eyes: Pupils are equal, round, and reactive to light. Conjunctivae and EOM are normal.  EOMs intact without pain.  Swollen, erythematous, and tender nodule to  the left upper lid.  No noted active drainage. Some edema noted to the left inferior periorbital region.    Visual Acuity  Right Eye Distance: 10/16 Left Eye Distance: 10/25 Bilateral Distance: 10/12.52  Right Eye Near:   Left Eye Near:    Bilateral Near:     Neck: Neck supple.  Cardiovascular: Normal rate and regular rhythm.  Pulmonary/Chest: Effort normal.  Neurological: He is alert.  Skin: Skin is warm and dry. He is not diaphoretic. No pallor.  Psychiatric: He has a normal mood and affect. His behavior is normal.  Nursing note and vitals reviewed.        ED Treatments / Results  Labs (all labs ordered are listed, but  only abnormal results are displayed) Labs Reviewed - No data to display  EKG None  Radiology No results found.  Procedures Procedures (including critical care time)  Medications Ordered in ED Medications - No data to display   Initial Impression / Assessment and Plan / ED Course  I have reviewed the triage vital signs and the nursing notes.  Pertinent labs & imaging results that were available during my care of the patient were reviewed by me and considered in my medical decision making (see chart for details).  Clinical Course as of Nov 05 1034  Sun Nov 04, 2017  1010 Spoke with Dr. Charlotte SanesMcCuen, ophthalmology.  States patient will need to come to her office at 8:30 AM on Tuesday, June 25 for likely I&D.  Recommends topical ophthalmic bacitracin QID. Hot compresses and massage.   [SJ]    Clinical Course User Index [SJ] Anselm PancoastJoy, Bolton Canupp C, PA-C    Patient presents with swelling, erythema, and tenderness to the left upper eyelid beginning June 12.  Suspect external hordeolum.  Patient to follow-up with ophthalmology for I&D. The patient was given instructions for home care as well as return precautions. Patient voices understanding of these instructions, accepts the plan, and is comfortable with discharge.  Final Clinical Impressions(s) / ED Diagnoses   Final diagnoses:  Hordeolum internum of left upper eyelid    ED Discharge Orders        Ordered    bacitracin-polymyxin b (POLYSPORIN) ophthalmic ointment     11/04/17 1016       Anselm PancoastJoy, Jaylia Pettus C, PA-C 11/04/17 1041    Anselm PancoastJoy, Blayre Papania C, PA-C 11/04/17 1042    Benjiman CorePickering, Nathan, MD 11/04/17 1529

## 2017-11-06 DIAGNOSIS — M25521 Pain in right elbow: Secondary | ICD-10-CM | POA: Diagnosis not present

## 2017-11-06 DIAGNOSIS — H0014 Chalazion left upper eyelid: Secondary | ICD-10-CM | POA: Diagnosis not present

## 2017-11-06 DIAGNOSIS — M7711 Lateral epicondylitis, right elbow: Secondary | ICD-10-CM | POA: Diagnosis not present

## 2017-11-07 DIAGNOSIS — M7541 Impingement syndrome of right shoulder: Secondary | ICD-10-CM | POA: Diagnosis not present

## 2017-11-07 DIAGNOSIS — M19011 Primary osteoarthritis, right shoulder: Secondary | ICD-10-CM | POA: Diagnosis not present

## 2018-02-06 DIAGNOSIS — M7711 Lateral epicondylitis, right elbow: Secondary | ICD-10-CM | POA: Diagnosis not present

## 2018-02-06 DIAGNOSIS — M9901 Segmental and somatic dysfunction of cervical region: Secondary | ICD-10-CM | POA: Diagnosis not present

## 2018-02-06 DIAGNOSIS — M7541 Impingement syndrome of right shoulder: Secondary | ICD-10-CM | POA: Diagnosis not present

## 2018-02-06 DIAGNOSIS — M9903 Segmental and somatic dysfunction of lumbar region: Secondary | ICD-10-CM | POA: Diagnosis not present

## 2018-03-19 DIAGNOSIS — E039 Hypothyroidism, unspecified: Secondary | ICD-10-CM | POA: Diagnosis not present

## 2018-03-21 DIAGNOSIS — Z23 Encounter for immunization: Secondary | ICD-10-CM | POA: Diagnosis not present

## 2018-03-21 DIAGNOSIS — E039 Hypothyroidism, unspecified: Secondary | ICD-10-CM | POA: Diagnosis not present

## 2018-05-17 DIAGNOSIS — R600 Localized edema: Secondary | ICD-10-CM | POA: Diagnosis not present

## 2018-05-17 DIAGNOSIS — G8918 Other acute postprocedural pain: Secondary | ICD-10-CM | POA: Diagnosis not present

## 2018-05-17 DIAGNOSIS — S43491D Other sprain of right shoulder joint, subsequent encounter: Secondary | ICD-10-CM | POA: Diagnosis not present

## 2018-05-17 DIAGNOSIS — M75111 Incomplete rotator cuff tear or rupture of right shoulder, not specified as traumatic: Secondary | ICD-10-CM | POA: Diagnosis not present

## 2018-05-17 DIAGNOSIS — S43431A Superior glenoid labrum lesion of right shoulder, initial encounter: Secondary | ICD-10-CM | POA: Diagnosis not present

## 2018-05-17 DIAGNOSIS — M19011 Primary osteoarthritis, right shoulder: Secondary | ICD-10-CM | POA: Diagnosis not present

## 2018-05-17 DIAGNOSIS — M7541 Impingement syndrome of right shoulder: Secondary | ICD-10-CM | POA: Diagnosis not present

## 2018-05-17 DIAGNOSIS — M25511 Pain in right shoulder: Secondary | ICD-10-CM | POA: Diagnosis not present

## 2018-06-03 DIAGNOSIS — M25511 Pain in right shoulder: Secondary | ICD-10-CM | POA: Diagnosis not present

## 2018-06-10 DIAGNOSIS — M25511 Pain in right shoulder: Secondary | ICD-10-CM | POA: Diagnosis not present

## 2018-06-18 DIAGNOSIS — M25511 Pain in right shoulder: Secondary | ICD-10-CM | POA: Diagnosis not present

## 2018-06-25 DIAGNOSIS — M25511 Pain in right shoulder: Secondary | ICD-10-CM | POA: Diagnosis not present

## 2018-09-20 DIAGNOSIS — E782 Mixed hyperlipidemia: Secondary | ICD-10-CM | POA: Diagnosis not present

## 2018-09-20 DIAGNOSIS — E039 Hypothyroidism, unspecified: Secondary | ICD-10-CM | POA: Diagnosis not present

## 2018-09-20 DIAGNOSIS — E559 Vitamin D deficiency, unspecified: Secondary | ICD-10-CM | POA: Diagnosis not present

## 2018-11-08 DIAGNOSIS — E559 Vitamin D deficiency, unspecified: Secondary | ICD-10-CM | POA: Diagnosis not present

## 2018-11-08 DIAGNOSIS — Z0000 Encounter for general adult medical examination without abnormal findings: Secondary | ICD-10-CM | POA: Diagnosis not present

## 2018-11-08 DIAGNOSIS — Z1159 Encounter for screening for other viral diseases: Secondary | ICD-10-CM | POA: Diagnosis not present

## 2018-11-08 DIAGNOSIS — Z125 Encounter for screening for malignant neoplasm of prostate: Secondary | ICD-10-CM | POA: Diagnosis not present

## 2018-11-12 DIAGNOSIS — Z1211 Encounter for screening for malignant neoplasm of colon: Secondary | ICD-10-CM | POA: Diagnosis not present

## 2018-11-12 DIAGNOSIS — Z6830 Body mass index (BMI) 30.0-30.9, adult: Secondary | ICD-10-CM | POA: Diagnosis not present

## 2018-11-12 DIAGNOSIS — Z0000 Encounter for general adult medical examination without abnormal findings: Secondary | ICD-10-CM | POA: Diagnosis not present

## 2018-12-17 ENCOUNTER — Other Ambulatory Visit: Payer: Self-pay

## 2018-12-17 DIAGNOSIS — R6889 Other general symptoms and signs: Secondary | ICD-10-CM | POA: Diagnosis not present

## 2018-12-17 DIAGNOSIS — Z20822 Contact with and (suspected) exposure to covid-19: Secondary | ICD-10-CM

## 2018-12-18 LAB — NOVEL CORONAVIRUS, NAA: SARS-CoV-2, NAA: NOT DETECTED

## 2019-05-20 DIAGNOSIS — Z0000 Encounter for general adult medical examination without abnormal findings: Secondary | ICD-10-CM | POA: Diagnosis not present

## 2019-05-20 DIAGNOSIS — E559 Vitamin D deficiency, unspecified: Secondary | ICD-10-CM | POA: Diagnosis not present

## 2019-05-20 DIAGNOSIS — Z125 Encounter for screening for malignant neoplasm of prostate: Secondary | ICD-10-CM | POA: Diagnosis not present

## 2019-05-23 DIAGNOSIS — E782 Mixed hyperlipidemia: Secondary | ICD-10-CM | POA: Diagnosis not present

## 2019-05-23 DIAGNOSIS — E039 Hypothyroidism, unspecified: Secondary | ICD-10-CM | POA: Diagnosis not present

## 2019-05-23 DIAGNOSIS — E559 Vitamin D deficiency, unspecified: Secondary | ICD-10-CM | POA: Diagnosis not present

## 2019-05-23 DIAGNOSIS — Z6829 Body mass index (BMI) 29.0-29.9, adult: Secondary | ICD-10-CM | POA: Diagnosis not present

## 2019-07-02 DIAGNOSIS — S61231A Puncture wound without foreign body of left index finger without damage to nail, initial encounter: Secondary | ICD-10-CM | POA: Diagnosis not present

## 2019-08-04 ENCOUNTER — Ambulatory Visit: Payer: BC Managed Care – PPO

## 2019-11-12 DIAGNOSIS — Z0000 Encounter for general adult medical examination without abnormal findings: Secondary | ICD-10-CM | POA: Diagnosis not present

## 2019-11-12 DIAGNOSIS — Z0184 Encounter for antibody response examination: Secondary | ICD-10-CM | POA: Diagnosis not present

## 2019-11-12 DIAGNOSIS — Z125 Encounter for screening for malignant neoplasm of prostate: Secondary | ICD-10-CM | POA: Diagnosis not present

## 2019-11-12 DIAGNOSIS — E782 Mixed hyperlipidemia: Secondary | ICD-10-CM | POA: Diagnosis not present

## 2019-11-18 DIAGNOSIS — Z1211 Encounter for screening for malignant neoplasm of colon: Secondary | ICD-10-CM | POA: Diagnosis not present

## 2019-11-18 DIAGNOSIS — Z0000 Encounter for general adult medical examination without abnormal findings: Secondary | ICD-10-CM | POA: Diagnosis not present

## 2020-02-13 DIAGNOSIS — E039 Hypothyroidism, unspecified: Secondary | ICD-10-CM | POA: Diagnosis not present

## 2020-02-16 DIAGNOSIS — E782 Mixed hyperlipidemia: Secondary | ICD-10-CM | POA: Diagnosis not present

## 2020-02-16 DIAGNOSIS — I10 Essential (primary) hypertension: Secondary | ICD-10-CM | POA: Diagnosis not present

## 2020-02-16 DIAGNOSIS — J309 Allergic rhinitis, unspecified: Secondary | ICD-10-CM | POA: Diagnosis not present

## 2020-02-16 DIAGNOSIS — E039 Hypothyroidism, unspecified: Secondary | ICD-10-CM | POA: Diagnosis not present

## 2020-02-20 DIAGNOSIS — Z23 Encounter for immunization: Secondary | ICD-10-CM | POA: Diagnosis not present

## 2020-04-16 ENCOUNTER — Ambulatory Visit: Payer: Self-pay | Attending: Internal Medicine

## 2020-04-16 DIAGNOSIS — Z23 Encounter for immunization: Secondary | ICD-10-CM

## 2020-04-16 NOTE — Progress Notes (Signed)
   Covid-19 Vaccination Clinic  Name:  Robert Montes    MRN: [id number] DOB: 02-23-1966  04/16/2020  [name] was observed post Covid-19 immunization for 15 minutes without incident. He was provided with Vaccine Information Sheet and instruction to access the V-Safe system.   [name] was instructed to call 911 with any severe reactions post vaccine: Marland Kitchen Difficulty breathing  . Swelling of face and throat  . A fast heartbeat  . A bad rash all over body  . Dizziness and weakness   Immunizations Administered    Name Date Dose VIS Date Route   Pfizer COVID-19 Vaccine 04/16/2020  4:19 PM 0.3 mL 03/03/2020 Intramuscular   Manufacturer: [employer], [employer]   Lot: [id number]   NDC: [address]-6

## 2020-08-05 DIAGNOSIS — M9901 Segmental and somatic dysfunction of cervical region: Secondary | ICD-10-CM | POA: Diagnosis not present

## 2020-08-05 DIAGNOSIS — M9905 Segmental and somatic dysfunction of pelvic region: Secondary | ICD-10-CM | POA: Diagnosis not present

## 2020-08-05 DIAGNOSIS — M9902 Segmental and somatic dysfunction of thoracic region: Secondary | ICD-10-CM | POA: Diagnosis not present

## 2020-08-05 DIAGNOSIS — M9903 Segmental and somatic dysfunction of lumbar region: Secondary | ICD-10-CM | POA: Diagnosis not present

## 2020-08-13 DIAGNOSIS — M9901 Segmental and somatic dysfunction of cervical region: Secondary | ICD-10-CM | POA: Diagnosis not present

## 2020-08-13 DIAGNOSIS — M9905 Segmental and somatic dysfunction of pelvic region: Secondary | ICD-10-CM | POA: Diagnosis not present

## 2020-08-13 DIAGNOSIS — M9903 Segmental and somatic dysfunction of lumbar region: Secondary | ICD-10-CM | POA: Diagnosis not present

## 2020-08-13 DIAGNOSIS — M9902 Segmental and somatic dysfunction of thoracic region: Secondary | ICD-10-CM | POA: Diagnosis not present

## 2020-08-24 DIAGNOSIS — M9902 Segmental and somatic dysfunction of thoracic region: Secondary | ICD-10-CM | POA: Diagnosis not present

## 2020-08-24 DIAGNOSIS — M9901 Segmental and somatic dysfunction of cervical region: Secondary | ICD-10-CM | POA: Diagnosis not present

## 2020-08-24 DIAGNOSIS — M9905 Segmental and somatic dysfunction of pelvic region: Secondary | ICD-10-CM | POA: Diagnosis not present

## 2020-08-24 DIAGNOSIS — M9903 Segmental and somatic dysfunction of lumbar region: Secondary | ICD-10-CM | POA: Diagnosis not present

## 2020-09-03 DIAGNOSIS — M9903 Segmental and somatic dysfunction of lumbar region: Secondary | ICD-10-CM | POA: Diagnosis not present

## 2020-09-03 DIAGNOSIS — M9905 Segmental and somatic dysfunction of pelvic region: Secondary | ICD-10-CM | POA: Diagnosis not present

## 2020-09-03 DIAGNOSIS — M9902 Segmental and somatic dysfunction of thoracic region: Secondary | ICD-10-CM | POA: Diagnosis not present

## 2020-09-03 DIAGNOSIS — M9901 Segmental and somatic dysfunction of cervical region: Secondary | ICD-10-CM | POA: Diagnosis not present

## 2020-09-17 DIAGNOSIS — M9903 Segmental and somatic dysfunction of lumbar region: Secondary | ICD-10-CM | POA: Diagnosis not present

## 2020-09-17 DIAGNOSIS — M9905 Segmental and somatic dysfunction of pelvic region: Secondary | ICD-10-CM | POA: Diagnosis not present

## 2020-09-17 DIAGNOSIS — M9902 Segmental and somatic dysfunction of thoracic region: Secondary | ICD-10-CM | POA: Diagnosis not present

## 2020-09-17 DIAGNOSIS — M9901 Segmental and somatic dysfunction of cervical region: Secondary | ICD-10-CM | POA: Diagnosis not present

## 2020-10-01 DIAGNOSIS — M9901 Segmental and somatic dysfunction of cervical region: Secondary | ICD-10-CM | POA: Diagnosis not present

## 2020-10-01 DIAGNOSIS — M9902 Segmental and somatic dysfunction of thoracic region: Secondary | ICD-10-CM | POA: Diagnosis not present

## 2020-10-01 DIAGNOSIS — M9903 Segmental and somatic dysfunction of lumbar region: Secondary | ICD-10-CM | POA: Diagnosis not present

## 2020-10-01 DIAGNOSIS — M9905 Segmental and somatic dysfunction of pelvic region: Secondary | ICD-10-CM | POA: Diagnosis not present

## 2020-10-15 DIAGNOSIS — M9905 Segmental and somatic dysfunction of pelvic region: Secondary | ICD-10-CM | POA: Diagnosis not present

## 2020-10-15 DIAGNOSIS — M9901 Segmental and somatic dysfunction of cervical region: Secondary | ICD-10-CM | POA: Diagnosis not present

## 2020-10-15 DIAGNOSIS — M9902 Segmental and somatic dysfunction of thoracic region: Secondary | ICD-10-CM | POA: Diagnosis not present

## 2020-10-15 DIAGNOSIS — M9903 Segmental and somatic dysfunction of lumbar region: Secondary | ICD-10-CM | POA: Diagnosis not present

## 2020-11-05 DIAGNOSIS — M9903 Segmental and somatic dysfunction of lumbar region: Secondary | ICD-10-CM | POA: Diagnosis not present

## 2020-11-05 DIAGNOSIS — M9901 Segmental and somatic dysfunction of cervical region: Secondary | ICD-10-CM | POA: Diagnosis not present

## 2020-11-05 DIAGNOSIS — M9902 Segmental and somatic dysfunction of thoracic region: Secondary | ICD-10-CM | POA: Diagnosis not present

## 2020-11-05 DIAGNOSIS — M9905 Segmental and somatic dysfunction of pelvic region: Secondary | ICD-10-CM | POA: Diagnosis not present

## 2021-04-01 DIAGNOSIS — Z125 Encounter for screening for malignant neoplasm of prostate: Secondary | ICD-10-CM | POA: Diagnosis not present

## 2021-04-01 DIAGNOSIS — Z0000 Encounter for general adult medical examination without abnormal findings: Secondary | ICD-10-CM | POA: Diagnosis not present

## 2021-04-04 DIAGNOSIS — Z1211 Encounter for screening for malignant neoplasm of colon: Secondary | ICD-10-CM | POA: Diagnosis not present

## 2021-04-04 DIAGNOSIS — E039 Hypothyroidism, unspecified: Secondary | ICD-10-CM | POA: Diagnosis not present

## 2021-04-04 DIAGNOSIS — H6122 Impacted cerumen, left ear: Secondary | ICD-10-CM | POA: Diagnosis not present

## 2021-04-04 DIAGNOSIS — H6121 Impacted cerumen, right ear: Secondary | ICD-10-CM | POA: Diagnosis not present

## 2021-04-04 DIAGNOSIS — Z0000 Encounter for general adult medical examination without abnormal findings: Secondary | ICD-10-CM | POA: Diagnosis not present

## 2021-04-04 DIAGNOSIS — H6123 Impacted cerumen, bilateral: Secondary | ICD-10-CM | POA: Diagnosis not present

## 2021-04-06 DIAGNOSIS — M25652 Stiffness of left hip, not elsewhere classified: Secondary | ICD-10-CM | POA: Diagnosis not present

## 2021-04-06 DIAGNOSIS — M7918 Myalgia, other site: Secondary | ICD-10-CM | POA: Diagnosis not present

## 2021-04-06 DIAGNOSIS — M9903 Segmental and somatic dysfunction of lumbar region: Secondary | ICD-10-CM | POA: Diagnosis not present

## 2021-04-06 DIAGNOSIS — M9902 Segmental and somatic dysfunction of thoracic region: Secondary | ICD-10-CM | POA: Diagnosis not present

## 2021-04-06 DIAGNOSIS — M25651 Stiffness of right hip, not elsewhere classified: Secondary | ICD-10-CM | POA: Diagnosis not present

## 2021-04-06 DIAGNOSIS — M545 Low back pain, unspecified: Secondary | ICD-10-CM | POA: Diagnosis not present

## 2021-04-06 DIAGNOSIS — M9905 Segmental and somatic dysfunction of pelvic region: Secondary | ICD-10-CM | POA: Diagnosis not present

## 2021-04-06 DIAGNOSIS — M9901 Segmental and somatic dysfunction of cervical region: Secondary | ICD-10-CM | POA: Diagnosis not present

## 2021-04-12 DIAGNOSIS — H0011 Chalazion right upper eyelid: Secondary | ICD-10-CM | POA: Diagnosis not present

## 2021-05-03 DIAGNOSIS — M545 Low back pain, unspecified: Secondary | ICD-10-CM | POA: Diagnosis not present

## 2021-05-03 DIAGNOSIS — M7918 Myalgia, other site: Secondary | ICD-10-CM | POA: Diagnosis not present

## 2021-05-03 DIAGNOSIS — M9901 Segmental and somatic dysfunction of cervical region: Secondary | ICD-10-CM | POA: Diagnosis not present

## 2021-05-03 DIAGNOSIS — M25651 Stiffness of right hip, not elsewhere classified: Secondary | ICD-10-CM | POA: Diagnosis not present

## 2021-05-03 DIAGNOSIS — M9903 Segmental and somatic dysfunction of lumbar region: Secondary | ICD-10-CM | POA: Diagnosis not present

## 2021-05-03 DIAGNOSIS — M9902 Segmental and somatic dysfunction of thoracic region: Secondary | ICD-10-CM | POA: Diagnosis not present

## 2021-05-03 DIAGNOSIS — M25652 Stiffness of left hip, not elsewhere classified: Secondary | ICD-10-CM | POA: Diagnosis not present

## 2021-05-03 DIAGNOSIS — M9905 Segmental and somatic dysfunction of pelvic region: Secondary | ICD-10-CM | POA: Diagnosis not present

## 2021-05-23 DIAGNOSIS — R7989 Other specified abnormal findings of blood chemistry: Secondary | ICD-10-CM | POA: Diagnosis not present

## 2021-05-23 DIAGNOSIS — E782 Mixed hyperlipidemia: Secondary | ICD-10-CM | POA: Diagnosis not present

## 2021-05-23 DIAGNOSIS — E039 Hypothyroidism, unspecified: Secondary | ICD-10-CM | POA: Diagnosis not present

## 2021-06-16 DIAGNOSIS — M9903 Segmental and somatic dysfunction of lumbar region: Secondary | ICD-10-CM | POA: Diagnosis not present

## 2021-06-16 DIAGNOSIS — M9905 Segmental and somatic dysfunction of pelvic region: Secondary | ICD-10-CM | POA: Diagnosis not present

## 2021-06-16 DIAGNOSIS — M9901 Segmental and somatic dysfunction of cervical region: Secondary | ICD-10-CM | POA: Diagnosis not present

## 2021-06-16 DIAGNOSIS — M9902 Segmental and somatic dysfunction of thoracic region: Secondary | ICD-10-CM | POA: Diagnosis not present

## 2021-07-11 DIAGNOSIS — M9901 Segmental and somatic dysfunction of cervical region: Secondary | ICD-10-CM | POA: Diagnosis not present

## 2021-07-11 DIAGNOSIS — M9902 Segmental and somatic dysfunction of thoracic region: Secondary | ICD-10-CM | POA: Diagnosis not present

## 2021-07-11 DIAGNOSIS — M9905 Segmental and somatic dysfunction of pelvic region: Secondary | ICD-10-CM | POA: Diagnosis not present

## 2021-07-11 DIAGNOSIS — M9903 Segmental and somatic dysfunction of lumbar region: Secondary | ICD-10-CM | POA: Diagnosis not present

## 2021-07-26 DIAGNOSIS — E782 Mixed hyperlipidemia: Secondary | ICD-10-CM | POA: Diagnosis not present

## 2021-07-26 DIAGNOSIS — E039 Hypothyroidism, unspecified: Secondary | ICD-10-CM | POA: Diagnosis not present

## 2021-07-26 DIAGNOSIS — I10 Essential (primary) hypertension: Secondary | ICD-10-CM | POA: Diagnosis not present

## 2021-08-03 DIAGNOSIS — M9901 Segmental and somatic dysfunction of cervical region: Secondary | ICD-10-CM | POA: Diagnosis not present

## 2021-08-03 DIAGNOSIS — M9903 Segmental and somatic dysfunction of lumbar region: Secondary | ICD-10-CM | POA: Diagnosis not present

## 2021-08-03 DIAGNOSIS — M9905 Segmental and somatic dysfunction of pelvic region: Secondary | ICD-10-CM | POA: Diagnosis not present

## 2021-08-03 DIAGNOSIS — M9902 Segmental and somatic dysfunction of thoracic region: Secondary | ICD-10-CM | POA: Diagnosis not present

## 2021-08-19 DIAGNOSIS — M9901 Segmental and somatic dysfunction of cervical region: Secondary | ICD-10-CM | POA: Diagnosis not present

## 2021-08-19 DIAGNOSIS — M9902 Segmental and somatic dysfunction of thoracic region: Secondary | ICD-10-CM | POA: Diagnosis not present

## 2021-08-19 DIAGNOSIS — M9903 Segmental and somatic dysfunction of lumbar region: Secondary | ICD-10-CM | POA: Diagnosis not present

## 2021-08-19 DIAGNOSIS — M9905 Segmental and somatic dysfunction of pelvic region: Secondary | ICD-10-CM | POA: Diagnosis not present

## 2021-09-08 DIAGNOSIS — M9901 Segmental and somatic dysfunction of cervical region: Secondary | ICD-10-CM | POA: Diagnosis not present

## 2021-09-08 DIAGNOSIS — M9905 Segmental and somatic dysfunction of pelvic region: Secondary | ICD-10-CM | POA: Diagnosis not present

## 2021-09-08 DIAGNOSIS — M9902 Segmental and somatic dysfunction of thoracic region: Secondary | ICD-10-CM | POA: Diagnosis not present

## 2021-09-08 DIAGNOSIS — M9903 Segmental and somatic dysfunction of lumbar region: Secondary | ICD-10-CM | POA: Diagnosis not present

## 2021-09-27 DIAGNOSIS — M9902 Segmental and somatic dysfunction of thoracic region: Secondary | ICD-10-CM | POA: Diagnosis not present

## 2021-09-27 DIAGNOSIS — M9905 Segmental and somatic dysfunction of pelvic region: Secondary | ICD-10-CM | POA: Diagnosis not present

## 2021-09-27 DIAGNOSIS — M9903 Segmental and somatic dysfunction of lumbar region: Secondary | ICD-10-CM | POA: Diagnosis not present

## 2021-09-27 DIAGNOSIS — M9901 Segmental and somatic dysfunction of cervical region: Secondary | ICD-10-CM | POA: Diagnosis not present

## 2021-10-24 DIAGNOSIS — E039 Hypothyroidism, unspecified: Secondary | ICD-10-CM | POA: Diagnosis not present

## 2021-10-26 DIAGNOSIS — I10 Essential (primary) hypertension: Secondary | ICD-10-CM | POA: Diagnosis not present

## 2021-10-26 DIAGNOSIS — E039 Hypothyroidism, unspecified: Secondary | ICD-10-CM | POA: Diagnosis not present

## 2021-10-27 DIAGNOSIS — M9901 Segmental and somatic dysfunction of cervical region: Secondary | ICD-10-CM | POA: Diagnosis not present

## 2021-10-27 DIAGNOSIS — M9902 Segmental and somatic dysfunction of thoracic region: Secondary | ICD-10-CM | POA: Diagnosis not present

## 2021-10-27 DIAGNOSIS — M9903 Segmental and somatic dysfunction of lumbar region: Secondary | ICD-10-CM | POA: Diagnosis not present

## 2021-10-27 DIAGNOSIS — M9905 Segmental and somatic dysfunction of pelvic region: Secondary | ICD-10-CM | POA: Diagnosis not present

## 2021-11-17 DIAGNOSIS — M9905 Segmental and somatic dysfunction of pelvic region: Secondary | ICD-10-CM | POA: Diagnosis not present

## 2021-11-17 DIAGNOSIS — M9902 Segmental and somatic dysfunction of thoracic region: Secondary | ICD-10-CM | POA: Diagnosis not present

## 2021-11-17 DIAGNOSIS — M9901 Segmental and somatic dysfunction of cervical region: Secondary | ICD-10-CM | POA: Diagnosis not present

## 2021-11-17 DIAGNOSIS — M9903 Segmental and somatic dysfunction of lumbar region: Secondary | ICD-10-CM | POA: Diagnosis not present

## 2021-12-08 DIAGNOSIS — M9901 Segmental and somatic dysfunction of cervical region: Secondary | ICD-10-CM | POA: Diagnosis not present

## 2021-12-08 DIAGNOSIS — M9905 Segmental and somatic dysfunction of pelvic region: Secondary | ICD-10-CM | POA: Diagnosis not present

## 2021-12-08 DIAGNOSIS — M9902 Segmental and somatic dysfunction of thoracic region: Secondary | ICD-10-CM | POA: Diagnosis not present

## 2021-12-08 DIAGNOSIS — M9903 Segmental and somatic dysfunction of lumbar region: Secondary | ICD-10-CM | POA: Diagnosis not present

## 2021-12-27 DIAGNOSIS — M9903 Segmental and somatic dysfunction of lumbar region: Secondary | ICD-10-CM | POA: Diagnosis not present

## 2021-12-27 DIAGNOSIS — M9905 Segmental and somatic dysfunction of pelvic region: Secondary | ICD-10-CM | POA: Diagnosis not present

## 2021-12-27 DIAGNOSIS — M9902 Segmental and somatic dysfunction of thoracic region: Secondary | ICD-10-CM | POA: Diagnosis not present

## 2021-12-27 DIAGNOSIS — M9901 Segmental and somatic dysfunction of cervical region: Secondary | ICD-10-CM | POA: Diagnosis not present

## 2022-01-26 DIAGNOSIS — M9905 Segmental and somatic dysfunction of pelvic region: Secondary | ICD-10-CM | POA: Diagnosis not present

## 2022-01-26 DIAGNOSIS — M9902 Segmental and somatic dysfunction of thoracic region: Secondary | ICD-10-CM | POA: Diagnosis not present

## 2022-01-26 DIAGNOSIS — M9901 Segmental and somatic dysfunction of cervical region: Secondary | ICD-10-CM | POA: Diagnosis not present

## 2022-01-26 DIAGNOSIS — M9903 Segmental and somatic dysfunction of lumbar region: Secondary | ICD-10-CM | POA: Diagnosis not present

## 2022-03-01 DIAGNOSIS — M9905 Segmental and somatic dysfunction of pelvic region: Secondary | ICD-10-CM | POA: Diagnosis not present

## 2022-03-01 DIAGNOSIS — M9903 Segmental and somatic dysfunction of lumbar region: Secondary | ICD-10-CM | POA: Diagnosis not present

## 2022-03-01 DIAGNOSIS — M9902 Segmental and somatic dysfunction of thoracic region: Secondary | ICD-10-CM | POA: Diagnosis not present

## 2022-03-01 DIAGNOSIS — M9901 Segmental and somatic dysfunction of cervical region: Secondary | ICD-10-CM | POA: Diagnosis not present

## 2022-03-30 DIAGNOSIS — Z0000 Encounter for general adult medical examination without abnormal findings: Secondary | ICD-10-CM | POA: Diagnosis not present

## 2022-03-30 DIAGNOSIS — M9905 Segmental and somatic dysfunction of pelvic region: Secondary | ICD-10-CM | POA: Diagnosis not present

## 2022-03-30 DIAGNOSIS — M9901 Segmental and somatic dysfunction of cervical region: Secondary | ICD-10-CM | POA: Diagnosis not present

## 2022-03-30 DIAGNOSIS — Z125 Encounter for screening for malignant neoplasm of prostate: Secondary | ICD-10-CM | POA: Diagnosis not present

## 2022-03-30 DIAGNOSIS — Z114 Encounter for screening for human immunodeficiency virus [HIV]: Secondary | ICD-10-CM | POA: Diagnosis not present

## 2022-03-30 DIAGNOSIS — M9903 Segmental and somatic dysfunction of lumbar region: Secondary | ICD-10-CM | POA: Diagnosis not present

## 2022-03-30 DIAGNOSIS — M9902 Segmental and somatic dysfunction of thoracic region: Secondary | ICD-10-CM | POA: Diagnosis not present

## 2022-03-30 DIAGNOSIS — E782 Mixed hyperlipidemia: Secondary | ICD-10-CM | POA: Diagnosis not present

## 2022-04-11 DIAGNOSIS — Z0000 Encounter for general adult medical examination without abnormal findings: Secondary | ICD-10-CM | POA: Diagnosis not present

## 2022-05-29 DIAGNOSIS — M9903 Segmental and somatic dysfunction of lumbar region: Secondary | ICD-10-CM | POA: Diagnosis not present

## 2022-05-29 DIAGNOSIS — M9905 Segmental and somatic dysfunction of pelvic region: Secondary | ICD-10-CM | POA: Diagnosis not present

## 2022-05-29 DIAGNOSIS — M9901 Segmental and somatic dysfunction of cervical region: Secondary | ICD-10-CM | POA: Diagnosis not present

## 2022-05-29 DIAGNOSIS — M9902 Segmental and somatic dysfunction of thoracic region: Secondary | ICD-10-CM | POA: Diagnosis not present

## 2022-06-26 DIAGNOSIS — M9901 Segmental and somatic dysfunction of cervical region: Secondary | ICD-10-CM | POA: Diagnosis not present

## 2022-06-26 DIAGNOSIS — M9903 Segmental and somatic dysfunction of lumbar region: Secondary | ICD-10-CM | POA: Diagnosis not present

## 2022-06-26 DIAGNOSIS — M9902 Segmental and somatic dysfunction of thoracic region: Secondary | ICD-10-CM | POA: Diagnosis not present

## 2022-06-26 DIAGNOSIS — M9905 Segmental and somatic dysfunction of pelvic region: Secondary | ICD-10-CM | POA: Diagnosis not present

## 2022-07-17 DIAGNOSIS — M9903 Segmental and somatic dysfunction of lumbar region: Secondary | ICD-10-CM | POA: Diagnosis not present

## 2022-07-17 DIAGNOSIS — M9905 Segmental and somatic dysfunction of pelvic region: Secondary | ICD-10-CM | POA: Diagnosis not present

## 2022-07-17 DIAGNOSIS — M9901 Segmental and somatic dysfunction of cervical region: Secondary | ICD-10-CM | POA: Diagnosis not present

## 2022-07-17 DIAGNOSIS — M9902 Segmental and somatic dysfunction of thoracic region: Secondary | ICD-10-CM | POA: Diagnosis not present

## 2022-08-15 DIAGNOSIS — M9902 Segmental and somatic dysfunction of thoracic region: Secondary | ICD-10-CM | POA: Diagnosis not present

## 2022-08-15 DIAGNOSIS — M9903 Segmental and somatic dysfunction of lumbar region: Secondary | ICD-10-CM | POA: Diagnosis not present

## 2022-08-15 DIAGNOSIS — M9905 Segmental and somatic dysfunction of pelvic region: Secondary | ICD-10-CM | POA: Diagnosis not present

## 2022-08-15 DIAGNOSIS — M9901 Segmental and somatic dysfunction of cervical region: Secondary | ICD-10-CM | POA: Diagnosis not present

## 2022-08-24 ENCOUNTER — Ambulatory Visit (HOSPITAL_COMMUNITY)
Admission: RE | Admit: 2022-08-24 | Discharge: 2022-08-24 | Disposition: A | Discharge status: Home / Self Care | Payer: BC Managed Care – PPO | Source: Ambulatory Visit

## 2022-08-24 ENCOUNTER — Encounter (HOSPITAL_COMMUNITY): Payer: Self-pay

## 2022-08-24 VITALS — BP 144/88 | HR 69 | Temp 97.5°F | Resp 16

## 2022-08-24 DIAGNOSIS — W57XXXA Bitten or stung by nonvenomous insect and other nonvenomous arthropods, initial encounter: Secondary | ICD-10-CM

## 2022-08-24 DIAGNOSIS — S30860A Insect bite (nonvenomous) of lower back and pelvis, initial encounter: Secondary | ICD-10-CM

## 2022-08-24 MED ORDER — DOXYCYCLINE HYCLATE 100 MG PO CAPS: 100.0000 mg | ORAL_CAPSULE | Freq: Two times a day (BID) | ORAL | 0 refills | Status: AC

## 2022-08-24 MED ORDER — MUPIROCIN CALCIUM 2 % EX CREA: 1.0000 | TOPICAL_CREAM | Freq: Two times a day (BID) | CUTANEOUS | 0 refills | Status: AC

## 2022-08-24 NOTE — ED Triage Notes (Signed)
Pt states he has bit by a tick on his back and removed it on Monday since he has bodyaches, he does state there is some there is some redness to the area and enlarging. He did bring the tick to clinic today.

## 2022-08-24 NOTE — Discharge Instructions (Signed)
We are treating you for tick borne illness. Please take doxycycline as directed(avoid sunlight as it may cause rash/sun sensitivity. Apply mupirocin to area as prescribed. Follow up with PCP next week for wound recheck. Return as needed.

## 2022-08-24 NOTE — ED Provider Notes (Signed)
[facility]    CSN: [id number] Arrival date & time: 08/24/22  1229      History   Chief Complaint Chief Complaint  Patient presents with   Tick Removal    Tick has been removed, but a welt is getting bigger. Tick removed on 08/21/2022 at home. - Entered by patient    HPI Robert Montes is a 57 y.o. male.   57 year old male pt, Robert Montes, presents to urgent care after removing tick from right lower back. Pt does report some redness, itching at site,bodyaches. Pt has quarter size erythematous area where tick was attached. No drainage, afebrile in office.  The history is provided by the patient. No language interpreter was used.    Past Medical History:  Diagnosis Date   High cholesterol    Thyroid disease     Patient Active Problem List   Diagnosis Date Noted   Tick bite of lower back 08/24/2022    Past Surgical History:  Procedure Laterality Date   SHOULDER SURGERY Bilateral        Home Medications    Prior to Admission medications   Medication Sig Start Date End Date Taking? Authorizing Provider  aspirin EC 81 MG tablet Take 81 mg by mouth daily.     Yes [provider]  atorvastatin (LIPITOR) 10 MG tablet Take 10 mg by mouth daily.     Yes [provider]  doxycycline (VIBRAMYCIN) 100 MG capsule Take 1 capsule (100 mg total) by mouth 2 (two) times daily for 14 days. 08/24/22 09/07/22 Yes Valary Manahan, Para March, NP  fenofibrate 160 MG tablet 1 tablet every day by oral route. 08/14/14  Yes [provider]  levothyroxine (SYNTHROID, LEVOTHROID) 112 MCG tablet Take 112 mcg by mouth daily.     Yes [provider]  mupirocin cream (BACTROBAN) 2 % Apply 1 Application topically 2 (two) times daily. 08/24/22  Yes Phat Dalton, Para March, NP  bacitracin-polymyxin b (POLYSPORIN) ophthalmic ointment Apply to the left eyelid four times daily while awake. 11/04/17   Joy, Shawn C, PA-C  HYDROcodone-acetaminophen (NORCO/VICODIN) 5-325 MG per  tablet Take 1 tablet by mouth every 4 (four) hours as needed. 07/26/14   Harle Battiest, NP  ibuprofen (ADVIL,MOTRIN) 200 MG tablet Take 400 mg by mouth every 6 (six) hours as needed. For pain.     [provider]  naproxen (NAPROSYN) 500 MG tablet Take 1 tablet (500 mg total) by mouth 2 (two) times daily. 07/26/14   Harle Battiest, NP  Pseudoeph-Doxylamine-DM-APAP (NYQUIL PO) Take 2 capsules by mouth every 8 (eight) hours as needed. For cold.     [provider]    Family History History reviewed. No pertinent family history.  Social History Social History   Tobacco Use   Smoking status: Never   Smokeless tobacco: Never  Vaping Use   Vaping Use: Never used  Substance Use Topics   Alcohol use: Yes   Drug use: Never     Allergies   Patient has no known allergies.   Review of Systems Review of Systems  Musculoskeletal:  Positive for myalgias.  Skin:  Positive for color change and wound.  All other systems reviewed and are negative.    Physical Exam Triage Vital Signs ED Triage Vitals  Enc Vitals Group     BP      Pulse      Resp      Temp      Temp src  SpO2      Weight      Height      Head Circumference      Peak Flow      Pain Score      Pain Loc      Pain Edu?      Excl. in GC?    No data found.  Updated Vital Signs BP (!) 144/88 (BP Location: Left Arm)   Pulse 69   Temp (!) 97.5 F (36.4 C) (Oral)   Resp 16   SpO2 97%   Visual Acuity Right Eye Distance:   Left Eye Distance:   Bilateral Distance:    Right Eye Near:   Left Eye Near:    Bilateral Near:     Physical Exam Vitals and nursing note reviewed.  Constitutional:      General: He is not in acute distress.    Appearance: He is well-developed and well-groomed.  HENT:     Head: Normocephalic and atraumatic.  Eyes:     Conjunctiva/sclera: Conjunctivae normal.  Cardiovascular:     Rate and Rhythm: Normal rate and regular rhythm.     Heart sounds: No  murmur heard. Pulmonary:     Effort: Pulmonary effort is normal. No respiratory distress.     Breath sounds: Normal breath sounds and air entry.  Abdominal:     Palpations: Abdomen is soft.     Tenderness: There is no abdominal tenderness.  Musculoskeletal:        General: No swelling.     Cervical back: Neck supple.  Skin:    General: Skin is warm and dry.     Capillary Refill: Capillary refill takes less than 2 seconds.       Neurological:     General: No focal deficit present.     Mental Status: He is alert and oriented to person, place, and time.     GCS: GCS eye subscore is 4. GCS verbal subscore is 5. GCS motor subscore is 6.  Psychiatric:        Attention and Perception: Attention normal.        Mood and Affect: Mood normal.        Speech: Speech normal.        Behavior: Behavior normal. Behavior is cooperative.      UC Treatments / Results  Labs (all labs ordered are listed, but only abnormal results are displayed) Labs Reviewed - No data to display  EKG   Radiology No results found.  Procedures Procedures (including critical care time)  Medications Ordered in UC Medications - No data to display  Initial Impression / Assessment and Plan / UC Course  I have reviewed the triage vital signs and the nursing notes.  Pertinent labs & imaging results that were available during my care of the patient were reviewed by me and considered in my medical decision making (see chart for details).    Pt verbalized understanding to this provider  Ddx: Tick bite, lyme disease, ehrlichiosis,RMSF,Babesiosis Final Clinical Impressions(s) / UC Diagnoses   Final diagnoses:  Tick bite of lower back, initial encounter     Discharge Instructions      We are treating you for tick borne illness. Please take doxycycline as directed(avoid sunlight as it may cause rash/sun sensitivity. Apply mupirocin to area as prescribed. Follow up with PCP next week for wound recheck.  Return as needed.      ED Prescriptions     Medication Sig Dispense Auth. Provider  doxycycline (VIBRAMYCIN) 100 MG capsule Take 1 capsule (100 mg total) by mouth 2 (two) times daily for 14 days. 28 capsule Javarian Jakubiak, NP   mupirocin cream (BACTROBAN) 2 % Apply 1 Application topically 2 (two) times daily. 15 g Pailyn Bellevue, Para MarchJeanette, NP      PDMP not reviewed this encounter.   Clancy Gourdefelice, Shaman Muscarella, NP 08/24/22 1448

## 2022-09-21 DIAGNOSIS — M9903 Segmental and somatic dysfunction of lumbar region: Secondary | ICD-10-CM | POA: Diagnosis not present

## 2022-09-21 DIAGNOSIS — M9901 Segmental and somatic dysfunction of cervical region: Secondary | ICD-10-CM | POA: Diagnosis not present

## 2022-09-21 DIAGNOSIS — M9905 Segmental and somatic dysfunction of pelvic region: Secondary | ICD-10-CM | POA: Diagnosis not present

## 2022-09-21 DIAGNOSIS — M9902 Segmental and somatic dysfunction of thoracic region: Secondary | ICD-10-CM | POA: Diagnosis not present

## 2022-09-26 DIAGNOSIS — E782 Mixed hyperlipidemia: Secondary | ICD-10-CM | POA: Diagnosis not present

## 2022-09-26 DIAGNOSIS — E039 Hypothyroidism, unspecified: Secondary | ICD-10-CM | POA: Diagnosis not present

## 2022-09-26 DIAGNOSIS — E559 Vitamin D deficiency, unspecified: Secondary | ICD-10-CM | POA: Diagnosis not present

## 2022-10-03 DIAGNOSIS — E559 Vitamin D deficiency, unspecified: Secondary | ICD-10-CM | POA: Diagnosis not present

## 2022-10-03 DIAGNOSIS — E782 Mixed hyperlipidemia: Secondary | ICD-10-CM | POA: Diagnosis not present

## 2022-10-03 DIAGNOSIS — E039 Hypothyroidism, unspecified: Secondary | ICD-10-CM | POA: Diagnosis not present

## 2023-04-06 DIAGNOSIS — Z13220 Encounter for screening for lipoid disorders: Secondary | ICD-10-CM | POA: Diagnosis not present

## 2023-04-06 DIAGNOSIS — Z125 Encounter for screening for malignant neoplasm of prostate: Secondary | ICD-10-CM | POA: Diagnosis not present

## 2023-04-06 DIAGNOSIS — Z0000 Encounter for general adult medical examination without abnormal findings: Secondary | ICD-10-CM | POA: Diagnosis not present

## 2023-04-06 DIAGNOSIS — Z114 Encounter for screening for human immunodeficiency virus [HIV]: Secondary | ICD-10-CM | POA: Diagnosis not present

## 2023-04-17 DIAGNOSIS — Z23 Encounter for immunization: Secondary | ICD-10-CM | POA: Diagnosis not present

## 2023-04-17 DIAGNOSIS — Z0000 Encounter for general adult medical examination without abnormal findings: Secondary | ICD-10-CM | POA: Diagnosis not present

## 2023-04-17 DIAGNOSIS — R7301 Impaired fasting glucose: Secondary | ICD-10-CM | POA: Diagnosis not present

## 2023-06-27 DIAGNOSIS — M9905 Segmental and somatic dysfunction of pelvic region: Secondary | ICD-10-CM | POA: Diagnosis not present

## 2023-06-27 DIAGNOSIS — M9903 Segmental and somatic dysfunction of lumbar region: Secondary | ICD-10-CM | POA: Diagnosis not present

## 2023-06-27 DIAGNOSIS — M9901 Segmental and somatic dysfunction of cervical region: Secondary | ICD-10-CM | POA: Diagnosis not present

## 2023-06-27 DIAGNOSIS — M9902 Segmental and somatic dysfunction of thoracic region: Secondary | ICD-10-CM | POA: Diagnosis not present

## 2023-07-23 DIAGNOSIS — M9903 Segmental and somatic dysfunction of lumbar region: Secondary | ICD-10-CM | POA: Diagnosis not present

## 2023-07-23 DIAGNOSIS — M9905 Segmental and somatic dysfunction of pelvic region: Secondary | ICD-10-CM | POA: Diagnosis not present

## 2023-07-23 DIAGNOSIS — M9902 Segmental and somatic dysfunction of thoracic region: Secondary | ICD-10-CM | POA: Diagnosis not present

## 2023-07-23 DIAGNOSIS — M9901 Segmental and somatic dysfunction of cervical region: Secondary | ICD-10-CM | POA: Diagnosis not present

## 2023-08-13 DIAGNOSIS — M9901 Segmental and somatic dysfunction of cervical region: Secondary | ICD-10-CM | POA: Diagnosis not present

## 2023-08-13 DIAGNOSIS — M9902 Segmental and somatic dysfunction of thoracic region: Secondary | ICD-10-CM | POA: Diagnosis not present

## 2023-08-13 DIAGNOSIS — M9905 Segmental and somatic dysfunction of pelvic region: Secondary | ICD-10-CM | POA: Diagnosis not present

## 2023-08-13 DIAGNOSIS — M9903 Segmental and somatic dysfunction of lumbar region: Secondary | ICD-10-CM | POA: Diagnosis not present

## 2023-09-04 DIAGNOSIS — D225 Melanocytic nevi of trunk: Secondary | ICD-10-CM | POA: Diagnosis not present

## 2023-09-04 DIAGNOSIS — L814 Other melanin hyperpigmentation: Secondary | ICD-10-CM | POA: Diagnosis not present

## 2023-09-04 DIAGNOSIS — L820 Inflamed seborrheic keratosis: Secondary | ICD-10-CM | POA: Diagnosis not present

## 2023-09-04 DIAGNOSIS — D2371 Other benign neoplasm of skin of right lower limb, including hip: Secondary | ICD-10-CM | POA: Diagnosis not present

## 2023-09-04 DIAGNOSIS — L570 Actinic keratosis: Secondary | ICD-10-CM | POA: Diagnosis not present

## 2023-09-10 DIAGNOSIS — M9901 Segmental and somatic dysfunction of cervical region: Secondary | ICD-10-CM | POA: Diagnosis not present

## 2023-09-10 DIAGNOSIS — M9903 Segmental and somatic dysfunction of lumbar region: Secondary | ICD-10-CM | POA: Diagnosis not present

## 2023-09-10 DIAGNOSIS — M9902 Segmental and somatic dysfunction of thoracic region: Secondary | ICD-10-CM | POA: Diagnosis not present

## 2023-09-10 DIAGNOSIS — M9905 Segmental and somatic dysfunction of pelvic region: Secondary | ICD-10-CM | POA: Diagnosis not present

## 2023-10-02 DIAGNOSIS — E559 Vitamin D deficiency, unspecified: Secondary | ICD-10-CM | POA: Diagnosis not present

## 2023-10-02 DIAGNOSIS — R739 Hyperglycemia, unspecified: Secondary | ICD-10-CM | POA: Diagnosis not present

## 2023-10-02 DIAGNOSIS — R5383 Other fatigue: Secondary | ICD-10-CM | POA: Diagnosis not present

## 2023-10-11 DIAGNOSIS — M9905 Segmental and somatic dysfunction of pelvic region: Secondary | ICD-10-CM | POA: Diagnosis not present

## 2023-10-11 DIAGNOSIS — M9901 Segmental and somatic dysfunction of cervical region: Secondary | ICD-10-CM | POA: Diagnosis not present

## 2023-10-11 DIAGNOSIS — M9902 Segmental and somatic dysfunction of thoracic region: Secondary | ICD-10-CM | POA: Diagnosis not present

## 2023-10-11 DIAGNOSIS — M9903 Segmental and somatic dysfunction of lumbar region: Secondary | ICD-10-CM | POA: Diagnosis not present

## 2023-10-12 DIAGNOSIS — Z79899 Other long term (current) drug therapy: Secondary | ICD-10-CM | POA: Diagnosis not present

## 2023-10-12 DIAGNOSIS — E559 Vitamin D deficiency, unspecified: Secondary | ICD-10-CM | POA: Diagnosis not present

## 2023-10-12 DIAGNOSIS — E039 Hypothyroidism, unspecified: Secondary | ICD-10-CM | POA: Diagnosis not present

## 2023-10-12 DIAGNOSIS — R7303 Prediabetes: Secondary | ICD-10-CM | POA: Diagnosis not present

## 2023-11-01 DIAGNOSIS — M9905 Segmental and somatic dysfunction of pelvic region: Secondary | ICD-10-CM | POA: Diagnosis not present

## 2023-11-01 DIAGNOSIS — M9902 Segmental and somatic dysfunction of thoracic region: Secondary | ICD-10-CM | POA: Diagnosis not present

## 2023-11-01 DIAGNOSIS — M9901 Segmental and somatic dysfunction of cervical region: Secondary | ICD-10-CM | POA: Diagnosis not present

## 2023-11-01 DIAGNOSIS — M9903 Segmental and somatic dysfunction of lumbar region: Secondary | ICD-10-CM | POA: Diagnosis not present

## 2023-11-22 DIAGNOSIS — M9901 Segmental and somatic dysfunction of cervical region: Secondary | ICD-10-CM | POA: Diagnosis not present

## 2023-11-22 DIAGNOSIS — M9902 Segmental and somatic dysfunction of thoracic region: Secondary | ICD-10-CM | POA: Diagnosis not present

## 2023-11-22 DIAGNOSIS — M9905 Segmental and somatic dysfunction of pelvic region: Secondary | ICD-10-CM | POA: Diagnosis not present

## 2023-11-22 DIAGNOSIS — M9903 Segmental and somatic dysfunction of lumbar region: Secondary | ICD-10-CM | POA: Diagnosis not present

## 2023-12-17 DIAGNOSIS — M9905 Segmental and somatic dysfunction of pelvic region: Secondary | ICD-10-CM | POA: Diagnosis not present

## 2023-12-17 DIAGNOSIS — M9902 Segmental and somatic dysfunction of thoracic region: Secondary | ICD-10-CM | POA: Diagnosis not present

## 2023-12-17 DIAGNOSIS — M9903 Segmental and somatic dysfunction of lumbar region: Secondary | ICD-10-CM | POA: Diagnosis not present

## 2023-12-17 DIAGNOSIS — M9901 Segmental and somatic dysfunction of cervical region: Secondary | ICD-10-CM | POA: Diagnosis not present

## 2024-01-08 DIAGNOSIS — M9903 Segmental and somatic dysfunction of lumbar region: Secondary | ICD-10-CM | POA: Diagnosis not present

## 2024-01-08 DIAGNOSIS — M9905 Segmental and somatic dysfunction of pelvic region: Secondary | ICD-10-CM | POA: Diagnosis not present

## 2024-01-08 DIAGNOSIS — M9902 Segmental and somatic dysfunction of thoracic region: Secondary | ICD-10-CM | POA: Diagnosis not present

## 2024-01-08 DIAGNOSIS — M9901 Segmental and somatic dysfunction of cervical region: Secondary | ICD-10-CM | POA: Diagnosis not present

## 2024-01-29 DIAGNOSIS — M9903 Segmental and somatic dysfunction of lumbar region: Secondary | ICD-10-CM | POA: Diagnosis not present

## 2024-01-29 DIAGNOSIS — M9901 Segmental and somatic dysfunction of cervical region: Secondary | ICD-10-CM | POA: Diagnosis not present

## 2024-01-29 DIAGNOSIS — M9902 Segmental and somatic dysfunction of thoracic region: Secondary | ICD-10-CM | POA: Diagnosis not present

## 2024-01-29 DIAGNOSIS — M9905 Segmental and somatic dysfunction of pelvic region: Secondary | ICD-10-CM | POA: Diagnosis not present

## 2024-02-25 DIAGNOSIS — M9901 Segmental and somatic dysfunction of cervical region: Secondary | ICD-10-CM | POA: Diagnosis not present

## 2024-02-25 DIAGNOSIS — M9905 Segmental and somatic dysfunction of pelvic region: Secondary | ICD-10-CM | POA: Diagnosis not present

## 2024-02-25 DIAGNOSIS — M9903 Segmental and somatic dysfunction of lumbar region: Secondary | ICD-10-CM | POA: Diagnosis not present

## 2024-02-25 DIAGNOSIS — M9902 Segmental and somatic dysfunction of thoracic region: Secondary | ICD-10-CM | POA: Diagnosis not present

## 2024-03-17 DIAGNOSIS — M9902 Segmental and somatic dysfunction of thoracic region: Secondary | ICD-10-CM | POA: Diagnosis not present

## 2024-03-17 DIAGNOSIS — M9905 Segmental and somatic dysfunction of pelvic region: Secondary | ICD-10-CM | POA: Diagnosis not present

## 2024-03-17 DIAGNOSIS — M9903 Segmental and somatic dysfunction of lumbar region: Secondary | ICD-10-CM | POA: Diagnosis not present

## 2024-03-17 DIAGNOSIS — M9901 Segmental and somatic dysfunction of cervical region: Secondary | ICD-10-CM | POA: Diagnosis not present

## 2024-04-08 DIAGNOSIS — M9902 Segmental and somatic dysfunction of thoracic region: Secondary | ICD-10-CM | POA: Diagnosis not present

## 2024-04-08 DIAGNOSIS — M9905 Segmental and somatic dysfunction of pelvic region: Secondary | ICD-10-CM | POA: Diagnosis not present

## 2024-04-08 DIAGNOSIS — M9901 Segmental and somatic dysfunction of cervical region: Secondary | ICD-10-CM | POA: Diagnosis not present

## 2024-04-08 DIAGNOSIS — M9903 Segmental and somatic dysfunction of lumbar region: Secondary | ICD-10-CM | POA: Diagnosis not present

## 2024-04-17 DIAGNOSIS — R5383 Other fatigue: Secondary | ICD-10-CM | POA: Diagnosis not present

## 2024-04-17 DIAGNOSIS — Z0000 Encounter for general adult medical examination without abnormal findings: Secondary | ICD-10-CM | POA: Diagnosis not present

## 2024-04-17 DIAGNOSIS — Z13220 Encounter for screening for lipoid disorders: Secondary | ICD-10-CM | POA: Diagnosis not present

## 2024-04-17 DIAGNOSIS — R739 Hyperglycemia, unspecified: Secondary | ICD-10-CM | POA: Diagnosis not present

## 2024-04-23 DIAGNOSIS — Z0000 Encounter for general adult medical examination without abnormal findings: Secondary | ICD-10-CM | POA: Diagnosis not present

## 2024-04-23 DIAGNOSIS — Z7185 Encounter for immunization safety counseling: Secondary | ICD-10-CM | POA: Diagnosis not present

## 2024-04-23 DIAGNOSIS — Z23 Encounter for immunization: Secondary | ICD-10-CM | POA: Diagnosis not present

## 2024-04-29 DIAGNOSIS — M9905 Segmental and somatic dysfunction of pelvic region: Secondary | ICD-10-CM | POA: Diagnosis not present

## 2024-04-29 DIAGNOSIS — M9902 Segmental and somatic dysfunction of thoracic region: Secondary | ICD-10-CM | POA: Diagnosis not present

## 2024-04-29 DIAGNOSIS — M9901 Segmental and somatic dysfunction of cervical region: Secondary | ICD-10-CM | POA: Diagnosis not present

## 2024-04-29 DIAGNOSIS — M9903 Segmental and somatic dysfunction of lumbar region: Secondary | ICD-10-CM | POA: Diagnosis not present
